# Patient Record
Sex: Male | Born: 1950 | Race: Black or African American | Hispanic: No | Marital: Married
Health system: Midwestern US, Community
[De-identification: ages and names within clinical notes are randomized; demographics above are authoritative.]

## PROBLEM LIST (undated history)

## (undated) DIAGNOSIS — E785 Hyperlipidemia, unspecified: Secondary | ICD-10-CM

## (undated) DIAGNOSIS — H409 Unspecified glaucoma: Secondary | ICD-10-CM

## (undated) DIAGNOSIS — IMO0002 Reserved for concepts with insufficient information to code with codable children: Secondary | ICD-10-CM

## (undated) DIAGNOSIS — I4891 Unspecified atrial fibrillation: Secondary | ICD-10-CM

## (undated) DIAGNOSIS — H269 Unspecified cataract: Secondary | ICD-10-CM

## (undated) HISTORY — DX: Unspecified cataract: H26.9

## (undated) HISTORY — DX: Unspecified atrial fibrillation: I48.91

## (undated) HISTORY — DX: Unspecified glaucoma: H40.9

## (undated) HISTORY — PX: COLONOSCOPY: SHX174

## (undated) HISTORY — DX: Hyperlipidemia, unspecified: E78.5

## (undated) HISTORY — DX: Reserved for concepts with insufficient information to code with codable children: IMO0002

---

## 2000-06-28 ENCOUNTER — Ambulatory Visit (HOSPITAL_COMMUNITY): Admission: RE | Admit: 2000-06-28 | Discharge: 2000-06-28 | Payer: Self-pay | Admitting: *Deleted

## 2000-06-28 ENCOUNTER — Encounter: Payer: Self-pay | Admitting: *Deleted

## 2000-07-05 ENCOUNTER — Ambulatory Visit (HOSPITAL_BASED_OUTPATIENT_CLINIC_OR_DEPARTMENT_OTHER): Admission: RE | Admit: 2000-07-05 | Discharge: 2000-07-06 | Payer: Self-pay | Admitting: *Deleted

## 2000-09-29 ENCOUNTER — Ambulatory Visit (HOSPITAL_BASED_OUTPATIENT_CLINIC_OR_DEPARTMENT_OTHER): Admission: RE | Admit: 2000-09-29 | Discharge: 2000-09-29 | Payer: Self-pay | Admitting: *Deleted

## 2005-06-01 ENCOUNTER — Ambulatory Visit: Payer: Self-pay | Admitting: Internal Medicine

## 2005-07-08 ENCOUNTER — Ambulatory Visit: Payer: Self-pay | Admitting: Internal Medicine

## 2005-07-23 ENCOUNTER — Ambulatory Visit: Payer: Self-pay | Admitting: Internal Medicine

## 2005-08-23 ENCOUNTER — Emergency Department (HOSPITAL_COMMUNITY): Admission: EM | Admit: 2005-08-23 | Discharge: 2005-08-23 | Payer: Self-pay | Admitting: Emergency Medicine

## 2006-01-21 ENCOUNTER — Ambulatory Visit: Payer: Self-pay | Admitting: Family Medicine

## 2007-01-27 ENCOUNTER — Ambulatory Visit: Payer: Self-pay | Admitting: Family Medicine
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
## 2008-02-28 ENCOUNTER — Ambulatory Visit: Payer: Self-pay | Admitting: Family Medicine

## 2009-03-01 ENCOUNTER — Ambulatory Visit: Payer: Self-pay | Admitting: Family Medicine

## 2010-03-03 ENCOUNTER — Ambulatory Visit: Payer: Self-pay | Admitting: Family Medicine

## 2010-09-19 NOTE — Op Note (Signed)
[place]  Patient:    Riley Beasley, Riley Beasley                         MRN: [id number] Proc. Date: 07/05/00 Adm. Date:  [id number] Attending:  Kendell Bane CC:         Humberto Leep. Wyonia Hough, M.D.  Ronnald Nian, M.D.   Operative Report  PREOPERATIVE DIAGNOSIS:  Scaphoid nonunion, right wrist.  POSTOPERATIVE DIAGNOSIS:  Scaphoid nonunion, right wrist.  PROCEDURE:  Open reduction and internal fixation, right scaphoid, with iliac crest bone grafting.  SURGEON:  Lowell Bouton, M.D.  ANESTHESIA:  General.  OPERATIVE FINDINGS:  The patient had a scaphoid nonunion that was very unstable with a DISI deformity of the lunate.  The proximal pole was partially sclerotic; however, there was good bleeding at the nonunion site.  DESCRIPTION OF PROCEDURE:  Under general anesthesia with a tourniquet on the right arm, the right arm and right iliac crest were prepped and draped in the usual fashion and after exsanguinating the limb, the tourniquet was inflated to 250 mmHg.  A longitudinal incision was made just radial to the flexor carpi radialis tendon and zigzagged across the wrist crease out onto the base of the thumb.  Sharp dissection was carried through the subcutaneous tissues, and bleeding points were coagulated.  Sharp dissection was carried through the flexor carpi radialis sheath and the base of it, retracting the FCR tendon ulnarly.  Sharp dissection was carried down to the scaphoid and an Alm retractor was inserted for retraction.  The fracture site was easily identified, and a curette was used to debride the fracture site.  There were cystic changes, and the cystic material was removed from the fracture site.  A rongeur was used to level off the fracture edges.  The fracture was grossly unstable and easily opened up with wrist extension.  After completely clearing out the fracture site of any cystic debris, the edges of the bone  were drilled and there was good punctate bleeding from the proximal pole at the fracture site.  An oblique incision was then made over the right anterior iliac crest, and dissection was carried through the subcutaneous tissue with a needle-tip electrocautery.  Dissection was carried sharply with the electrocautery on the top of the iliac crest, and a Key elevator was used to elevate the periosteum medially and laterally.  A half-inch osteotome was then used to remove a wedge of bone from the top of the iliac crest.  A curette was used to remove cancellous bone.  The iliac crest donor site was then packed with a saline-soaked sponge.  The bone graft was then cleaned of any soft tissues using a rongeur and was placed into the scaphoid defect as an opening wedge volarly.  X-rays showed good position and improvement of the DISI deformity without complete correction.  Size 45 K-wires x 3 were then placed from distal to proximal in the distal fragment, making sure to cross the fracture site. The fracture was packed with cancellous bone and then the corticocancellous graft was inserted volarly with extra cortical bone to open the volar wedge even more.  X-rays showed good position of the graft, and the K-wires were passed across the graft, capturing it in the fracture site.  The pins were found to be adequately positioned on x-ray, and the DISI deformity had improved.  The pins were cut beneath the thenar eminence skin and buried.  The  wounds were then irrigated copiously with saline.  The iliac crest site bleeding was controlled with bone wax, and the fascia was closed with a 0 chromic suture.  The subcutaneous tissue was closed with 2-0 chromic and the skin with a 3-0 subcuticular Prolene.  Steri-Strips were applied, followed by a sterile dressing.  The wrist wound was closed with a 4-0 Vicryl in the ligament volarly, and the subcutaneous tissues over a vessel loop drain.  The skin was closed  with a 3-0 subcuticular Prolene.  Steri-Strips were applied, followed by sterile dressings and a thumb spica splint.  The tourniquet was released with good circulation to the hand.  The patient went to the recovery room awake and stable, in good condition. DD:  07/05/00 TD:  07/06/00 Job: [id number]

## 2010-09-19 NOTE — Op Note (Signed)
[place]  Patient:    Riley Beasley, Riley Beasley                         MRN: [id number] Proc. Date: 09/29/00 Adm. Date:  [id number] Attending:  Kendell Bane                           Operative Report  PREOPERATIVE DIAGNOSIS:  Status post open reduction internal fixation, right scaphoid nonunion.  POSTOPERATIVE DIAGNOSIS:  Status post open reduction internal fixation, right scaphoid nonunion.  OPERATION PERFORMED:  Removal of pins, right scaphoid.  SURGEON:  Lowell Bouton, M.D.  ANESTHESIA:  General.  OPERATIVE FINDINGS:  The patient had two pins that were buried deep in the muscle of the thenar eminence.  One pin came out quite easily and one came out more tightly.  DESCRIPTION OF PROCEDURE:  Under general anesthesia with the tourniquet on the right arm, the right hand was prepped and draped in the usual fashion and after elevating the limb, the tourniquet was inflated to  250 mmHg.  A 2 cm incision was made over the thenar eminence and blunt dissection carried through the subcutaneous tissues.  Blunt dissection was carried down through the thenar muscle and the pins were identified and were covered over by scar tissue.  This was incised and both pins were removed with a needle holder. The wound was irrigated and 0.5% Marcaine was used for pain control.  The wound was closed with 4-0 nylon sutures.  Sterile dressings were applied. The patient tolerated the procedure well and went to the recovery room awake and stable in good condition. DD:  09/29/00 TD:  09/29/00 Job: [id number]

## 2011-02-12 ENCOUNTER — Encounter: Payer: Self-pay | Admitting: Family Medicine

## 2011-03-11 ENCOUNTER — Ambulatory Visit (INDEPENDENT_AMBULATORY_CARE_PROVIDER_SITE_OTHER): Payer: BC Managed Care – PPO | Admitting: Family Medicine

## 2011-03-11 ENCOUNTER — Encounter: Payer: Self-pay | Admitting: Family Medicine

## 2011-03-11 VITALS — BP 128/84 | HR 64 | Ht 70.5 in | Wt 188.0 lb

## 2011-03-11 DIAGNOSIS — E559 Vitamin D deficiency, unspecified: Secondary | ICD-10-CM

## 2011-03-11 DIAGNOSIS — Z8679 Personal history of other diseases of the circulatory system: Secondary | ICD-10-CM

## 2011-03-11 DIAGNOSIS — Z23 Encounter for immunization: Secondary | ICD-10-CM

## 2011-03-11 DIAGNOSIS — Z0000 Encounter for general adult medical examination without abnormal findings: Secondary | ICD-10-CM

## 2011-03-11 DIAGNOSIS — Z8042 Family history of malignant neoplasm of prostate: Secondary | ICD-10-CM | POA: Insufficient documentation

## 2011-03-11 DIAGNOSIS — E785 Hyperlipidemia, unspecified: Secondary | ICD-10-CM | POA: Insufficient documentation

## 2011-03-11 LAB — POCT URINALYSIS DIPSTICK
Blood, UA: NEGATIVE
Glucose, UA: NEGATIVE
Ketones, UA: NEGATIVE
Spec Grav, UA: 1.015
Urobilinogen, UA: 0.2

## 2011-03-11 LAB — COMPREHENSIVE METABOLIC PANEL
Albumin: 4.8 g/dL (ref 3.5–5.2)
CO2: 26 mEq/L (ref 19–32)
Glucose, Bld: 85 mg/dL (ref 70–99)
Potassium: 4.5 mEq/L (ref 3.5–5.3)
Sodium: 139 mEq/L (ref 135–145)
Total Protein: 7.2 g/dL (ref 6.0–8.3)

## 2011-03-11 LAB — CBC WITH DIFFERENTIAL/PLATELET
Eosinophils Absolute: 0.1 10*3/uL (ref 0.0–0.7)
Hemoglobin: 15 g/dL (ref 13.0–17.0)
Lymphs Abs: 2.2 10*3/uL (ref 0.7–4.0)
MCH: 27 pg (ref 26.0–34.0)
Monocytes Relative: 9 % (ref 3–12)
Neutro Abs: 2.9 10*3/uL (ref 1.7–7.7)
Neutrophils Relative %: 50 % (ref 43–77)
RBC: 5.56 MIL/uL (ref 4.22–5.81)

## 2011-03-11 LAB — LIPID PANEL
Cholesterol: 221 mg/dL — ABNORMAL HIGH (ref 0–200)
Triglycerides: 115 mg/dL (ref <150)
VLDL: 23 mg/dL (ref 0–40)

## 2011-03-11 LAB — POC HEMOCCULT BLD/STL (OFFICE/1-CARD/DIAGNOSTIC): Fecal Occult Blood, POC: NEGATIVE

## 2011-03-11 NOTE — Patient Instructions (Signed)
Continues to take excellent care of yourself

## 2011-03-11 NOTE — Progress Notes (Signed)
Subjective:    Patient ID: Riley Beasley, male    DOB: 04-May-1951, 60 y.o.   MRN: [id number]  HPI He is here for complete examination. He has now gotten married. He has been dating this woman for approximately 15 years. He has been married twice in the past which made him a low reluctant to get married again but things are going quite well. He does have a previous history of atrial fibrillation but has not had difficulty with this. He also has a family history of prostate cancer. He does have a history of vitamin D deficiency. He has no particular concerns or complaints   Review of Systems  Constitutional: Negative.   HENT: Negative.   Eyes: Negative.   Respiratory: Negative.   Cardiovascular: Negative.   Gastrointestinal: Negative.   Genitourinary: Negative.   Musculoskeletal: Negative.   Skin: Negative.   Neurological: Negative.   Psychiatric/Behavioral: Negative.        Objective:   Physical Exam BP 128/84  Pulse 64  Ht 5' 10.5" (1.791 m)  Wt 188 lb (85.276 kg)  BMI 26.59 kg/m2  General Appearance:    Alert, cooperative, no distress, appears stated age  Head:    Normocephalic, without obvious abnormality, atraumatic  Eyes:    PERRL, conjunctiva/corneas clear, EOM's intact, fundi    benign  Ears:    Normal TM's and external ear canals  Nose:   Nares normal, mucosa normal, no drainage or sinus   tenderness  Throat:   Lips, mucosa, and tongue normal; teeth and gums normal  Neck:   Supple, no lymphadenopathy;  thyroid:  no   enlargement/tenderness/nodules; no carotid   bruit or JVD  Back:    Spine nontender, no curvature, ROM normal, no CVA     tenderness  Lungs:     Clear to auscultation bilaterally without wheezes, rales or     ronchi; respirations unlabored  Chest Wall:    No tenderness or deformity   Heart:    Regular rate and rhythm, S1 and S2 normal, no murmur, rub   or gallop  Breast Exam:    No chest wall tenderness, masses or gynecomastia  Abdomen:     Soft,  non-tender, nondistended, normoactive bowel sounds,    no masses, no hepatosplenomegaly  Genitalia:    Normal male external genitalia without lesions.  Testicles without masses.  No inguinal hernias.  Rectal:    Normal sphincter tone, no masses or tenderness; guaiac negative stool.  Prostate smooth, no nodules, not enlarged.  Extremities:   No clubbing, cyanosis or edema  Pulses:   2+ and symmetric all extremities  Skin:   Skin color, texture, turgor normal, no rashes or lesions  Lymph nodes:   Cervical, supraclavicular, and axillary nodes normal  Neurologic:   CNII-XII intact, normal strength, sensation and gait; reflexes 2+ and symmetric throughout          Psych:   Normal mood, affect, hygiene and grooming.           Assessment & Plan:   1. Routine general medical examination at a health care facility  Flu vaccine greater than or equal to 3yo with preservative IM, Tdap vaccine greater than or equal to 7yo IM, CBC with Differential, Comprehensive metabolic panel, Lipid panel, POCT Urinalysis Dipstick  2. History of atrial fibrillation    3. Hyperlipidemia LDL goal < 100  Lipid panel  4. Family history of prostate cancer  PSA  5. Vitamin D deficiency  Vitamin D 25  hydroxy   encouraged him to continue with his active lifestyle.

## 2011-03-12 LAB — VITAMIN D 25 HYDROXY (VIT D DEFICIENCY, FRACTURES): Vit D, 25-Hydroxy: 60 ng/mL (ref 30–89)

## 2012-03-09 ENCOUNTER — Encounter: Payer: Self-pay | Admitting: Internal Medicine

## 2012-03-15 ENCOUNTER — Ambulatory Visit (INDEPENDENT_AMBULATORY_CARE_PROVIDER_SITE_OTHER): Payer: BC Managed Care – PPO | Admitting: Family Medicine

## 2012-03-15 ENCOUNTER — Encounter: Payer: Self-pay | Admitting: Family Medicine

## 2012-03-15 VITALS — BP 116/70 | HR 70 | Ht 70.0 in | Wt 186.0 lb

## 2012-03-15 DIAGNOSIS — Z8042 Family history of malignant neoplasm of prostate: Secondary | ICD-10-CM

## 2012-03-15 DIAGNOSIS — Z8639 Personal history of other endocrine, nutritional and metabolic disease: Secondary | ICD-10-CM

## 2012-03-15 DIAGNOSIS — Z23 Encounter for immunization: Secondary | ICD-10-CM

## 2012-03-15 DIAGNOSIS — Z0000 Encounter for general adult medical examination without abnormal findings: Secondary | ICD-10-CM

## 2012-03-15 DIAGNOSIS — I4891 Unspecified atrial fibrillation: Secondary | ICD-10-CM

## 2012-03-15 DIAGNOSIS — E785 Hyperlipidemia, unspecified: Secondary | ICD-10-CM

## 2012-03-15 LAB — CBC WITH DIFFERENTIAL/PLATELET
Basophils Absolute: 0.1 10*3/uL (ref 0.0–0.1)
Basophils Relative: 1 % (ref 0–1)
Eosinophils Relative: 2 % (ref 0–5)
HCT: 46.9 % (ref 39.0–52.0)
Hemoglobin: 15.8 g/dL (ref 13.0–17.0)
MCHC: 33.7 g/dL (ref 30.0–36.0)
MCV: 82.3 fL (ref 78.0–100.0)
Monocytes Absolute: 0.5 10*3/uL (ref 0.1–1.0)
Monocytes Relative: 8 % (ref 3–12)
Neutro Abs: 3.4 10*3/uL (ref 1.7–7.7)
RDW: 15 % (ref 11.5–15.5)

## 2012-03-15 LAB — POCT URINALYSIS DIPSTICK
Bilirubin, UA: NEGATIVE
Glucose, UA: NEGATIVE
Ketones, UA: NEGATIVE
Leukocytes, UA: NEGATIVE
Nitrite, UA: NEGATIVE
pH, UA: 6

## 2012-03-15 LAB — HEMOCCULT GUIAC POC 1CARD (OFFICE)

## 2012-03-15 MED ORDER — INFLUENZA VIRUS VACC SPLIT PF IM SUSP: 0.5000 mL | Freq: Once | INTRAMUSCULAR | Status: DC

## 2012-03-15 NOTE — Progress Notes (Signed)
  Subjective:    Patient ID: Riley Beasley, male    DOB: 1951-02-04, 61 y.o.   MRN: [id number]  HPI He is here for complete examination. He continues on medications listed in the chart. He has no particular concerns or complaints. He is now involved in a [employer] exercise program. His work is going well. His marriage is also doing quite well. He has no plans of retiring anytime in the near future. Social and family history were reviewed  Review of Systems  Constitutional: Negative.   HENT: Negative.   Eyes: Negative.   Respiratory: Negative.   Cardiovascular: Negative.   Gastrointestinal: Negative.   Musculoskeletal: Negative.   Skin: Negative.   Neurological: Negative.   Hematological: Negative.   Psychiatric/Behavioral: Negative.        Objective:   Physical Exam BP 116/70  Pulse 70  Ht 5\' 10"  (1.778 m)  Wt 186 lb (84.369 kg)  BMI 26.69 kg/m2  General Appearance:    Alert, cooperative, no distress, appears stated age  Head:    Normocephalic, without obvious abnormality, atraumatic  Eyes:    PERRL, conjunctiva/corneas clear, EOM's intact, fundi    benign  Ears:    Normal TM's and external ear canals  Nose:   Nares normal, mucosa normal, no drainage or sinus   tenderness  Throat:   Lips, mucosa, and tongue normal; teeth and gums normal  Neck:   Supple, no lymphadenopathy;  thyroid:  no   enlargement/tenderness/nodules; no carotid   bruit or JVD  Back:    Spine nontender, no curvature, ROM normal, no CVA     tenderness  Lungs:     Clear to auscultation bilaterally without wheezes, rales or     ronchi; respirations unlabored  Chest Wall:    No tenderness or deformity   Heart:    Irregular rate and rhythm, S1 and S2 normal, no murmur, rub   or gallop  Breast Exam:    No chest wall tenderness, masses or gynecomastia  Abdomen:     Soft, non-tender, nondistended, normoactive bowel sounds,    no masses, no hepatosplenomegaly  Genitalia:    Normal male external genitalia without  lesions.  Testicles without masses.  No inguinal hernias.  Rectal:    Normal sphincter tone, no masses or tenderness; guaiac negative stool.  Prostate smooth, no nodules, not enlarged.  Extremities:   No clubbing, cyanosis or edema  Pulses:   2+ and symmetric all extremities  Skin:   Skin color, texture, turgor normal, no rashes or lesions  Lymph nodes:   Cervical, supraclavicular, and axillary nodes normal  Neurologic:   CNII-XII intact, normal strength, sensation and gait; reflexes 2+ and symmetric throughout          Psych:   Normal mood, affect, hygiene and grooming.         Assessment & Plan:   1. Routine general medical examination at a health care facility  POCT Urinalysis Dipstick, Vitamin D 25 hydroxy, Lipid panel, CBC with Differential, Comprehensive metabolic panel, Hemoccult - 1 Card (office)  2. Need for prophylactic vaccination and inoculation against influenza  influenza  inactive virus vaccine (FLUZONE/FLUARIX) injection 0.5 mL  3. History of vitamin D deficiency  Vitamin D 25 hydroxy  4. Hyperlipidemia LDL goal < 100  Lipid panel  5. Family history of prostate cancer  PSA  6. Atrial fibrillation     encouraged him to continue to take good care of himself.

## 2012-03-16 LAB — COMPREHENSIVE METABOLIC PANEL
Albumin: 4.5 g/dL (ref 3.5–5.2)
Alkaline Phosphatase: 54 U/L (ref 39–117)
BUN: 16 mg/dL (ref 6–23)
Calcium: 9.4 mg/dL (ref 8.4–10.5)
Chloride: 102 mEq/L (ref 96–112)
Creat: 1.23 mg/dL (ref 0.50–1.35)
Glucose, Bld: 95 mg/dL (ref 70–99)
Potassium: 4.5 mEq/L (ref 3.5–5.3)

## 2012-03-16 LAB — LIPID PANEL
Cholesterol: 194 mg/dL (ref 0–200)
HDL: 49 mg/dL (ref >39)
LDL Cholesterol: 123 mg/dL — ABNORMAL HIGH (ref 0–99)
Triglycerides: 111 mg/dL (ref <150)

## 2012-03-16 LAB — VITAMIN D 25 HYDROXY (VIT D DEFICIENCY, FRACTURES): Vit D, 25-Hydroxy: 59 ng/mL (ref 30–89)

## 2013-03-09 ENCOUNTER — Other Ambulatory Visit: Payer: Self-pay

## 2013-03-17 ENCOUNTER — Encounter: Payer: Self-pay | Admitting: Family Medicine

## 2013-03-17 ENCOUNTER — Ambulatory Visit (INDEPENDENT_AMBULATORY_CARE_PROVIDER_SITE_OTHER): Payer: No Typology Code available for payment source | Admitting: Family Medicine

## 2013-03-17 VITALS — BP 116/70 | HR 48 | Ht 70.0 in | Wt 183.0 lb

## 2013-03-17 DIAGNOSIS — Z8719 Personal history of other diseases of the digestive system: Secondary | ICD-10-CM

## 2013-03-17 DIAGNOSIS — Z125 Encounter for screening for malignant neoplasm of prostate: Secondary | ICD-10-CM

## 2013-03-17 DIAGNOSIS — Z87898 Personal history of other specified conditions: Secondary | ICD-10-CM

## 2013-03-17 DIAGNOSIS — Z23 Encounter for immunization: Secondary | ICD-10-CM

## 2013-03-17 DIAGNOSIS — Z0000 Encounter for general adult medical examination without abnormal findings: Secondary | ICD-10-CM

## 2013-03-17 DIAGNOSIS — Z8679 Personal history of other diseases of the circulatory system: Secondary | ICD-10-CM

## 2013-03-17 DIAGNOSIS — Z8639 Personal history of other endocrine, nutritional and metabolic disease: Secondary | ICD-10-CM

## 2013-03-17 DIAGNOSIS — R195 Other fecal abnormalities: Secondary | ICD-10-CM

## 2013-03-17 DIAGNOSIS — Z8042 Family history of malignant neoplasm of prostate: Secondary | ICD-10-CM

## 2013-03-17 LAB — CBC WITH DIFFERENTIAL/PLATELET
HCT: 45.2 % (ref 39.0–52.0)
Hemoglobin: 15.4 g/dL (ref 13.0–17.0)
Lymphocytes Relative: 32 % (ref 12–46)
Monocytes Absolute: 0.5 10*3/uL (ref 0.1–1.0)
Monocytes Relative: 8 % (ref 3–12)
Neutro Abs: 3.5 10*3/uL (ref 1.7–7.7)
WBC: 6.1 10*3/uL (ref 4.0–10.5)

## 2013-03-17 LAB — COMPREHENSIVE METABOLIC PANEL
BUN: 13 mg/dL (ref 6–23)
CO2: 30 mEq/L (ref 19–32)
Calcium: 9.3 mg/dL (ref 8.4–10.5)
Chloride: 101 mEq/L (ref 96–112)
Creat: 1.14 mg/dL (ref 0.50–1.35)

## 2013-03-17 LAB — HEMOCCULT GUIAC POC 1CARD (OFFICE)

## 2013-03-17 LAB — POCT URINALYSIS DIPSTICK
Glucose, UA: NEGATIVE
Spec Grav, UA: 1.01
Urobilinogen, UA: NEGATIVE

## 2013-03-17 NOTE — Progress Notes (Signed)
Subjective:    Patient ID: Riley Beasley, male    DOB: [birth year], 62 y.o.   MRN: [id number]  HPI He is here for complete examination. He takes excellent care of himself. He is involved in [employer]. He takes over-the-counter medications. There is a family history of prostate cancer. He also has remote history of atrial fibrillation. Review of his record indicates he was history of vitamin D deficiency. His family and social history were reviewed. Also has a previous history of hemorrhoids. His last colonoscopy was 2007. He also complains of bilateral elbow discomfort with physical activities. He does lift weights and notes pain with weight lifting. Review of Systems Negative except as above    Objective:   Physical Exam BP 116/70  Pulse 48  Ht 5\' 10"  (1.778 m)  Wt 183 lb (83.008 kg)  BMI 26.26 kg/m2  SpO2 99%  General Appearance:    Alert, cooperative, no distress, appears stated age  Head:    Normocephalic, without obvious abnormality, atraumatic  Eyes:    PERRL, conjunctiva/corneas clear, EOM's intact, fundi    benign  Ears:    Normal TM's and external ear canals  Nose:   Nares normal, mucosa normal, no drainage or sinus   tenderness  Throat:   Lips, mucosa, and tongue normal; teeth and gums normal  Neck:   Supple, no lymphadenopathy;  thyroid:  no   enlargement/tenderness/nodules; no carotid   bruit or JVD  Back:    Spine nontender, no curvature, ROM normal, no CVA     tenderness  Lungs:     Clear to auscultation bilaterally without wheezes, rales or     ronchi; respirations unlabored  Chest Wall:    No tenderness or deformity   Heart:    Regular rate and rhythm, S1 and S2 normal, no murmur, rub   or gallop  Breast Exam:    No chest wall tenderness, masses or gynecomastia  Abdomen:     Soft, non-tender, nondistended, normoactive bowel sounds,    no masses, no hepatosplenomegaly  Genitalia:    Normal male external genitalia without lesions.  Testicles without masses.  No inguinal  hernias.  Rectal:    Normal sphincter tone, no masses or tenderness; guaiac positive left stool.  Prostate smooth, no nodules, not enlarged.  Extremities:   No clubbing, cyanosis or edema.slight tenderness palpation noted over both lateral epicondyles.   Pulses:   2+ and symmetric all extremities  Skin:   Skin color, texture, turgor normal, no rashes or lesions  Lymph nodes:   Cervical, supraclavicular, and axillary nodes normal  Neurologic:   CNII-XII intact, normal strength, sensation and gait; reflexes 2+ and symmetric throughout          Psych:   Normal mood, affect, hygiene and grooming.          Assessment & Plan:  Routine general medical examination at a health care facility - Plan: POCT Urinalysis Dipstick, Hemoccult - 1 Card (office), Comprehensive metabolic panel, CBC with Differential, PSA  Need for prophylactic vaccination and inoculation against influenza - Plan: Flu Vaccine QUAD 36+ mos PF IM (Fluarix)  History of atrial fibrillation  Family history of prostate cancer - Plan: PSA  History of hemorrhoids - Plan: Hemoccult - 1 Card (office)  History of vitamin D deficiency  Stool guaiac positive - Plan: Hemoccult - 1 Card (office), Ambulatory referral to Gastroenterology  Special screening for malignant neoplasm of prostate - Plan: PSA  explained that his elbow pain is heading  towards lateral epicondylitis and did explain doing as many things as possible pounds up and open. Flu shot given with risks and benefits discussed. Referral was made due to the guaiac-positive stool.

## 2013-03-18 LAB — VITAMIN D 25 HYDROXY (VIT D DEFICIENCY, FRACTURES): Vit D, 25-Hydroxy: 59 ng/mL (ref 30–89)

## 2013-03-20 ENCOUNTER — Encounter: Payer: Self-pay | Admitting: Internal Medicine

## 2013-04-19 ENCOUNTER — Ambulatory Visit (INDEPENDENT_AMBULATORY_CARE_PROVIDER_SITE_OTHER): Payer: No Typology Code available for payment source | Admitting: Internal Medicine

## 2013-04-19 ENCOUNTER — Encounter: Payer: Self-pay | Admitting: Internal Medicine

## 2013-04-19 ENCOUNTER — Telehealth: Payer: Self-pay | Admitting: Internal Medicine

## 2013-04-19 VITALS — BP 130/80 | HR 60 | Ht 70.0 in | Wt 188.0 lb

## 2013-04-19 DIAGNOSIS — R195 Other fecal abnormalities: Secondary | ICD-10-CM

## 2013-04-19 MED ORDER — NA SULFATE-K SULFATE-MG SULF 17.5-3.13-1.6 GM/177ML PO SOLN: ORAL | Status: DC

## 2013-04-19 NOTE — Assessment & Plan Note (Signed)
Cause not clear - could be hemorrhoids but last colonoscopy 2007 so appropriate to repeat colonoscopy. The risks and benefits as well as alternatives of endoscopic procedure(s) have been discussed and reviewed. All questions answered. The patient agrees to proceed.

## 2013-04-19 NOTE — Telephone Encounter (Signed)
Spoke to patient and put sample suprep kit up front for pick up.  He said thank you.

## 2013-04-19 NOTE — Patient Instructions (Signed)

## 2013-04-19 NOTE — Progress Notes (Signed)
         Subjective:  Referred by Dr. Sharlot Gowda   Patient ID: Riley Beasley, male    DOB: January 31, 1951, 62 y.o.   MRN: [id number]  HPI patient is a very pleasant middle-aged African American man known to me from previous colonoscopy in 2007. On recent physical exam he was found to be heme + on a digital rectal exam. He does describe rare BRBPR when wiping. Some belching no heartburn. Trial of over-the-counter Prilosec made no difference in the past. He says his dentist is told him he might have some erosions in his teeth related to acid reflux though he has no heartburn. GI review of systems is otherwise negative. No Known Allergies Outpatient Prescriptions Prior to Visit  Medication Sig Dispense Refill  . aspirin 81 MG tablet Take 81 mg by mouth daily.        . cholecalciferol (VITAMIN D) 1000 UNITS tablet Take 1,000 Units by mouth daily.      . fish oil-omega-3 fatty acids 1000 MG capsule Take 2 g by mouth daily.        Marland Kitchen glucosamine-chondroitin 500-400 MG tablet Take 1 tablet by mouth 3 (three) times daily.         No facility-administered medications prior to visit.   Past Medical History  Diagnosis Date  . Hemorrhoids   . Dyslipidemia   . Vitamin D deficiency   . Atrial fib/flutter, transient    Past Surgical History  Procedure Laterality Date  . Colonoscopy  2007    Dr. Leone Payor   History   Social History  . Marital Status: Married    Spouse Name: N/A    Number of Children: 3  . Years of Education: N/A   Occupational History  . engineer    Social History Main Topics  . Smoking status: Never Smoker   . Smokeless tobacco: Never Used  . Alcohol Use: Yes     Comment: beer 1-2 6packs over the weekend  . Drug Use: No  . Sexual Activity: Yes   Other Topics Concern  . None   Social History Narrative   Married, 2 sons 1 daughter   Engineer   2 caffeine drinks qd   Updated 04/19/2013         Family History  Problem Relation Age of Onset  . Prostate cancer  Brother   . Heart disease Brother    Review of Systems This is positive for those things mentiones in the HPI. All other review of systems are negative.      Objective:   Physical Exam General:  NAD Eyes:   anicteric Lungs:  clear Heart:  S1S2 no rubs, murmurs or gallops Abdomen:  soft and nontender, BS+, no HSM/mass Neuro/Psych: Alert and oriented, appropriate affect  Data Reviewed:  2007 colonoscopy will be scanned and filed Lab Results  Component Value Date   WBC 6.1 03/17/2013   HGB 15.4 03/17/2013   HCT 45.2 03/17/2013   MCV 80.9 03/17/2013   PLT 169 03/17/2013      Assessment & Plan:   1. Heme + stool    I appreciate the opportunity to care for this patient. CC: Carollee Herter, MD

## 2013-04-26 ENCOUNTER — Encounter: Payer: Self-pay | Admitting: Internal Medicine

## 2013-06-08 ENCOUNTER — Ambulatory Visit (AMBULATORY_SURGERY_CENTER): Payer: No Typology Code available for payment source | Admitting: Internal Medicine

## 2013-06-08 VITALS — BP 121/62 | HR 58 | Temp 97.4°F | Resp 17 | Ht 70.0 in | Wt 188.0 lb

## 2013-06-08 DIAGNOSIS — R195 Other fecal abnormalities: Secondary | ICD-10-CM

## 2013-06-08 DIAGNOSIS — K648 Other hemorrhoids: Secondary | ICD-10-CM

## 2013-06-08 MED ORDER — SODIUM CHLORIDE 0.9 % IV SOLN: 500.0000 mL | INTRAVENOUS | Status: DC

## 2013-06-08 NOTE — Progress Notes (Signed)
Report to pacu rn, vss, bbs=clear 

## 2013-06-08 NOTE — Op Note (Signed)
[facility] [address] [place], [id number]   COLONOSCOPY PROCEDURE REPORT  PATIENT: Riley ArabianDavis, Riley  MR#: [id number] BIRTHDATE: 06/24/1950 , 62  yrs. old GENDER: Male ENDOSCOPIST: Iva Booparl E Gessner, MD, [facility] PROCEDURE DATE:  06/08/2013 PROCEDURE:   Colonoscopy, diagnostic First Screening Colonoscopy - Avg.  risk and is 50 yrs.  old or older - No.  Prior Negative Screening - Now for repeat screening. N/A  History of Adenoma - Now for follow-up colonoscopy & has been > or = to 3 yrs.  N/A  Polyps Removed Today? No.  Recommend repeat exam, <10 yrs? No. ASA CLASS:   Class II INDICATIONS:heme-positive stool. MEDICATIONS: propofol (Diprivan) 300mg  IV, MAC sedation, administered by CRNA, and These medications were titrated to patient response per physician's verbal order  DESCRIPTION OF PROCEDURE:   After the risks benefits and alternatives of the procedure were thoroughly explained, informed consent was obtained.  A digital rectal exam revealed no abnormalities of the rectum, A digital rectal exam revealed no prostatic nodules, and A digital rectal exam revealed the prostate was not enlarged.   The LB [id number]  endoscope was introduced through the anus and advanced to the cecum, which was identified by both the appendix and ileocecal valve. No adverse events experienced.   The quality of the prep was excellent using Suprep  The instrument was then slowly withdrawn as the colon was fully examined.      COLON FINDINGS: A normal appearing cecum, ileocecal valve, and appendiceal orifice were identified.  The ascending, hepatic flexure, transverse, splenic flexure, descending, sigmoid colon and rectum appeared unremarkable.  No polyps or cancers were seen. Retroflexed views revealed internal hemorrhoids. The time to cecum=2 minutes 33 seconds.  Withdrawal time=11 minutes 47 seconds. The scope was withdrawn and the procedure completed. COMPLICATIONS: There were  no complications.  ENDOSCOPIC IMPRESSION: 1.   Normal colon - excellent prep 2.   Internal hemorrhoids - likely source heme + stool  RECOMMENDATIONS: Repeat routine colonoscopy 10 years - 2025   eSigned:  Iva Booparl E Gessner, MD, [address] 06/08/2013 2:40 PM   cc: Sharlot GowdaJohn LaLonde, MD and The Patient

## 2013-06-08 NOTE — Patient Instructions (Signed)
Discharge instructions given with verbal understanding. Handout on hemorrhoids. Resume previous medications. YOU HAD AN ENDOSCOPIC PROCEDURE TODAY AT THE [facility]: Refer to the procedure report that was given to you for any specific questions about what was found during the examination.  If the procedure report does not answer your questions, please call your gastroenterologist to clarify.  If you requested that your care partner not be given the details of your procedure findings, then the procedure report has been included in a sealed envelope for you to review at your convenience later.  YOU SHOULD EXPECT: Some feelings of bloating in the abdomen. Passage of more gas than usual.  Walking can help get rid of the air that was put into your GI tract during the procedure and reduce the bloating. If you had a lower endoscopy (such as a colonoscopy or flexible sigmoidoscopy) you may notice spotting of blood in your stool or on the toilet paper. If you underwent a bowel prep for your procedure, then you may not have a normal bowel movement for a few days.  DIET: Your first meal following the procedure should be a light meal and then it is ok to progress to your normal diet.  A half-sandwich or bowl of soup is an example of a good first meal.  Heavy or fried foods are harder to digest and may make you feel nauseous or bloated.  Likewise meals heavy in dairy and vegetables can cause extra gas to form and this can also increase the bloating.  Drink plenty of fluids but you should avoid alcoholic beverages for 24 hours.  ACTIVITY: Your care partner should take you home directly after the procedure.  You should plan to take it easy, moving slowly for the rest of the day.  You can resume normal activity the day after the procedure however you should NOT DRIVE or use heavy machinery for 24 hours (because of the sedation medicines used during the test).    SYMPTOMS TO REPORT IMMEDIATELY: A  gastroenterologist can be reached at any hour.  During normal business hours, 8:30 AM to 5:00 PM Monday through Friday, call [phone].  After hours and on weekends, please call the GI answering service at [phone] who will take a message and have the physician on call contact you.   Following lower endoscopy (colonoscopy or flexible sigmoidoscopy):  Excessive amounts of blood in the stool  Significant tenderness or worsening of abdominal pains  Swelling of the abdomen that is new, acute  Fever of 100F or higher  FOLLOW UP: If any biopsies were taken you will be contacted by phone or by letter within the next 1-3 weeks.  Call your gastroenterologist if you have not heard about the biopsies in 3 weeks.  Our staff will call the home number listed on your records the next business day following your procedure to check on you and address any questions or concerns that you may have at that time regarding the information given to you following your procedure. This is a courtesy call and so if there is no answer at the home number and we have not heard from you through the emergency physician on call, we will assume that you have returned to your regular daily activities without incident.  SIGNATURES/CONFIDENTIALITY: You and/or your care partner have signed paperwork which will be entered into your electronic medical record.  These signatures attest to the fact that that the information above on your After Visit Summary has been  reviewed and is understood.  Full responsibility of the confidentiality of this discharge information lies with you and/or your care-partner. 

## 2013-06-09 ENCOUNTER — Telehealth: Payer: Self-pay | Admitting: *Deleted

## 2013-06-09 NOTE — Telephone Encounter (Signed)
  Follow up Call-  Call back number 06/08/2013  Post procedure Call Back phone  # [phone]  Permission to leave phone message Yes     Patient questions:  Do you have a fever, pain , or abdominal swelling? no Pain Score  0 *  Have you tolerated food without any problems? yes  Have you been able to return to your normal activities? yes  Do you have any questions about your discharge instructions: Diet   no Medications  no Follow up visit  no  Do you have questions or concerns about your Care? no  Actions: * If pain score is 4 or above: No action needed, pain <4.

## 2013-09-26 ENCOUNTER — Encounter: Payer: Self-pay | Admitting: Family Medicine

## 2013-09-26 ENCOUNTER — Ambulatory Visit (INDEPENDENT_AMBULATORY_CARE_PROVIDER_SITE_OTHER): Payer: No Typology Code available for payment source | Admitting: Family Medicine

## 2013-09-26 VITALS — BP 120/82 | HR 72 | Wt 185.0 lb

## 2013-09-26 DIAGNOSIS — S39012A Strain of muscle, fascia and tendon of lower back, initial encounter: Secondary | ICD-10-CM

## 2013-09-26 DIAGNOSIS — IMO0002 Reserved for concepts with insufficient information to code with codable children: Secondary | ICD-10-CM

## 2013-09-26 NOTE — Progress Notes (Signed)
   Subjective:    Patient ID: Riley Beasley, male    DOB: [birth year], 63 y.o.   MRN: [id number]  HPI Last Thursday he was doing exercises and had no difficulty with them however woke up Friday morning with stiff back. He exercises with similar to ones he is always done in the past. He has been slowly getting better and today is even improving further. No numbness, tingling or weakness   Review of Systems     Objective:   Physical Exam Normal motion of his back. No tenderness to palpation noted. Negative straight leg raising.       Assessment & Plan:  Back strain  since he is slowly getting better, I recommended no further intervention. Discussed proper posturing.

## 2013-09-26 NOTE — Patient Instructions (Signed)
Proper posturing and you can take as much as 4 Advil 3 times per day

## 2014-02-14 ENCOUNTER — Encounter: Payer: Self-pay | Admitting: Internal Medicine

## 2014-02-16 ENCOUNTER — Other Ambulatory Visit: Payer: Self-pay

## 2014-03-22 ENCOUNTER — Encounter: Payer: Self-pay | Admitting: Family Medicine

## 2014-03-22 ENCOUNTER — Ambulatory Visit (INDEPENDENT_AMBULATORY_CARE_PROVIDER_SITE_OTHER): Payer: No Typology Code available for payment source | Admitting: Family Medicine

## 2014-03-22 VITALS — BP 120/80 | HR 70 | Ht 70.5 in | Wt 187.0 lb

## 2014-03-22 DIAGNOSIS — Z0000 Encounter for general adult medical examination without abnormal findings: Secondary | ICD-10-CM

## 2014-03-22 DIAGNOSIS — I482 Chronic atrial fibrillation, unspecified: Secondary | ICD-10-CM

## 2014-03-22 DIAGNOSIS — Z8042 Family history of malignant neoplasm of prostate: Secondary | ICD-10-CM

## 2014-03-22 DIAGNOSIS — E785 Hyperlipidemia, unspecified: Secondary | ICD-10-CM

## 2014-03-22 DIAGNOSIS — K640 First degree hemorrhoids: Secondary | ICD-10-CM

## 2014-03-22 DIAGNOSIS — H409 Unspecified glaucoma: Secondary | ICD-10-CM | POA: Insufficient documentation

## 2014-03-22 DIAGNOSIS — Z23 Encounter for immunization: Secondary | ICD-10-CM

## 2014-03-22 LAB — POCT URINALYSIS DIPSTICK
BILIRUBIN UA: NEGATIVE
Glucose, UA: NEGATIVE
Ketones, UA: NEGATIVE
Leukocytes, UA: NEGATIVE
Nitrite, UA: NEGATIVE
PH UA: 6
PROTEIN UA: 0.15
RBC UA: NEGATIVE
Spec Grav, UA: >=1.03
Urobilinogen, UA: NEGATIVE

## 2014-03-22 NOTE — Progress Notes (Signed)
Subjective:     Patient ID: Riley Beasley, male   DOB: 30-Jun-1950, 63 y.o.   MRN: [id number]  HPI  This is a 63 year old male with a history of irregular heart beat, glaucoma, and hyperlipidemia who is being seen for a comprehensive physical examination.  In the interval since his last visit he has had no acute complaints and his acute back pain resolved with conservative management.  He follows with his ophhtalmologist regularly for his glaucoma.  He is not smoking, happy in his relationship with his third wife, employed as an Horticulturist, commercialarchitectural engineer, and works out 4-5x weekly using p90x. He has a previous history of hyperlipidemia. A colonoscopy to follow-up on a positive hemoccult during the last year was notable only for an internal hemorrhoid.  He has not had his annual flu shot.  He has a CHADS2 score of 1.  Review of Systems  Constitutional: Negative for fever, chills and fatigue.  HENT: Positive for tinnitus. Negative for congestion and ear pain.   Eyes: Positive for visual disturbance.  Respiratory: Negative for chest tightness and shortness of breath.   Cardiovascular: Negative for chest pain and palpitations.  Gastrointestinal: Negative for nausea, vomiting, abdominal pain, constipation, blood in stool and abdominal distention.  Endocrine: Negative for polydipsia and polyuria.  Genitourinary: Negative for urgency and difficulty urinating.  Musculoskeletal: Negative for back pain and arthralgias.  Neurological: Negative for dizziness, syncope and headaches.  Hematological: Negative for adenopathy.  Psychiatric/Behavioral: Negative for dysphoric mood.   His medical, surgical, family, and social history were reviewed and updated in the medical record.  His family history is notable for alcoholism in his father, diabetes in his mother, and metastatic prostate cancer in his older brother.  His social history is notable for his excellent physical activity and drinking 6-12 beers on the  weekend.  He is a non-smoker.    Objective:   Physical Exam  Filed Vitals:   03/22/14 0827  BP: 120/80  Pulse: 70   General: Pleasant in NAD. HEENT: Tympanic membranes normal.  Nasal mucosa clear.  Oropharynx clear with normal dentition.  Fundoscopic exam limited due to blurring.  No lymphadenopathy or thyromegaly. CV: Normal S1/S2 without murmurs, gallops, or rubs.  Irregular heartbeat present Pulm: clear bilaterally with normal work of breathing Abd: Soft, nontender, normal bowel sounds, without tenderness to deep palpation.  Liver edge non-palpable.  Spleen non palpable. MSK/Derm:  No skin changes noted. Extremities: No cyanosis, clubbing, or palmar erythema.  2+ symmetric radial, DP, and PT pulses.  Labs: CBC, CMP, PSA in 2014 wnl.  Lipid panel in 2013 w/ LDL of 123  EKG today remarkable for Afib/Flutter, blunted q wave in V4, and irregular irregularity.   Review of EKG from 2007 is consistent with these findings and unchanged    Assessment:     This is a 63 year old male presenting for an annual well exam without acute complaints.  His major problem is glaucoma managed by his ophtalmologist.  Otherwise his arrhythmia is chronic & stable, he is normotensive, and his physical activity is exceptional.  His health management is up to date except for his flu vaccination which he will receive today. He does not need to be on anticoagulation according to his CHADS2 score.  His EKG performed at this visit is concerning for a remote infarct and chronic afib/flutter.  The R wave depression is increased on his current EKG, but modest, and likely represents inconsistent lead placement.   Plan:  Routine general medical examination at a health care facility - Plan: POCT Urinalysis Dipstick  Chronic atrial fibrillation - Chronic & cleared by cardiology. - Plan: EKG 12-Lead  Hyperlipidemia - Lipid panel today.  Glaucoma - Followed by ophtlamologist.  Family history of prostate cancer -  2014 PSA wnl.  Check today. - Plan: PSA  First degree hemorrhoids  Influenza vaccine needed - Plan: Flu Vaccine QUAD 36+ mos PF IM (Fluarix Quad PF)   - C. Merlyn Lotaylor Nipp, MS3, [facility] Seen with Dr. Ronnald NianJohn C. Lalonde who is in agreement with the above findings and plan.

## 2014-03-23 LAB — PSA: PSA: 1.34 ng/mL (ref <=4.00)

## 2014-06-12 DIAGNOSIS — H4011X2 Primary open-angle glaucoma, moderate stage: Secondary | ICD-10-CM | POA: Diagnosis not present

## 2014-07-17 NOTE — Telephone Encounter (Signed)
-----   Message from Diona BrownerJanae L Gillyard sent at 07/17/2014  3:12 PM EDT -----  Regarding: Dr. Justice BritainMoore/Telephone   Pt came by the office on last week requesting to re establish primary care with Christell ConstantMoore. Pt would like a call back regarding the doctor's decision. Please call pt today if possible at [phone] or home [phone].

## 2014-08-07 DIAGNOSIS — H5213 Myopia, bilateral: Secondary | ICD-10-CM | POA: Diagnosis not present

## 2014-08-07 DIAGNOSIS — H2513 Age-related nuclear cataract, bilateral: Secondary | ICD-10-CM | POA: Diagnosis not present

## 2014-08-07 DIAGNOSIS — H4011X2 Primary open-angle glaucoma, moderate stage: Secondary | ICD-10-CM | POA: Diagnosis not present

## 2014-08-16 ENCOUNTER — Encounter: Attending: Internal Medicine

## 2014-10-09 ENCOUNTER — Ambulatory Visit: Admit: 2014-10-09 | Payer: PRIVATE HEALTH INSURANCE | Attending: Internal Medicine

## 2014-10-09 DIAGNOSIS — I10 Essential (primary) hypertension: Secondary | ICD-10-CM

## 2014-10-09 NOTE — Progress Notes (Signed)
HISTORY OF PRESENT ILLNESS  Austin Walker is a 64 y.o. male.  HPI   Pt here to establish care  Has been receiving medical care form [facility] last several years  Internist at [facility] is Dr. Martyn Malay  Sees pysch Dr. Theodis Blaze  Has been on disability r/t depression, anxiety and stress since early this year  Worked in telecomunications  Has had htn hld controlled with medicines  Had labs recently , also has prediabetes but has been able to lose weight. Has discussed metformin with MD at [facility]  Has b/l knee pain and sciatica right leg  He will f/u at [facility] regularly for now  Plans on retiring this summer    Patient Active Problem List    Diagnosis Date Noted   ??? Hypertension with goal blood pressure less than 140/90 10/09/2014   ??? Pure hypercholesterolemia 10/09/2014   ??? Anxiety 10/09/2014   ??? Mild single current episode of major depressive disorder (HCC) 10/09/2014   ??? 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 OSA on CPAP 10/09/2014   ??? DJD (degenerative joint disease) 10/09/2014   ??? Sciatica 10/09/2014   ??? Prediabetes 10/09/2014   ??? Gastroesophageal reflux disease without esophagitis 10/09/2014   ??? TIA (transient ischemic attack) 10/09/2014   ??? Morbid obesity due to excess calories (HCC) 10/09/2014     Current Outpatient Prescriptions   Medication Sig Dispense Refill   ??? aspirin delayed-release 81 mg tablet Take  by mouth daily.     ??? atorvastatin (LIPITOR) 80 mg tablet Take 80 mg by mouth daily.     ??? capsicum oleoresin 0.025 % topical cream Apply  to affected area three (3) times daily.     ??? carboxymethylcellulose sodium 1 % dlgl Apply  to eye.     ??? fluticasone (FLONASE) 50 mcg/actuation nasal spray 2 Sprays by Both Nostrils route once.     ??? gabapentin (NEURONTIN) 600 mg tablet Take  by mouth two (2) times a day.     ??? ketotifen (ZADITOR) 0.025 % (0.035 %) ophthalmic solution Administer 1 Drop to both eyes two (2) times a day.     ??? loratadine (CLARITIN) 10 mg tablet Take 10 mg by mouth.      ??? naproxen (NAPROSYN) 375 mg tablet Take 375 mg by mouth two (2) times daily (with meals).     ??? NIFEdipine ER (PROCARDIA XL) 60 mg ER tablet Take 60 mg by mouth daily.     ??? sertraline (ZOLOFT) 100 mg tablet Take 150 mg by mouth.     ??? candesartan-hydrochlorothiazide (ATACAND HCT) 32-12.5 mg per tablet Take 1 Tab by mouth daily.     ??? ketoconazole (NIZORAL) 2 % topical cream Apply  to affected area daily.     ??? Omeprazole delayed release (PRILOSEC D/R) 20 mg tablet Take 20 mg by mouth daily.       No Known Allergies  No past medical history on file.  Past Surgical History   Procedure Laterality Date   ??? Hx urological       sx at [facility] due to inabilitly to void at 5-6   ??? Hx tonsillectomy       Family History   Problem Relation Age of Onset   ??? Lung Disease Mother      History   Substance Use Topics   ??? Smoking status: Never Smoker    ??? Smokeless tobacco: Not on file   ??? Alcohol Use: No  Review of Systems   Constitutional: Negative for fever, chills, weight loss and malaise/fatigue.   Eyes: Negative for blurred vision and double vision.   Respiratory: Negative for cough and shortness of breath.    Cardiovascular: Negative for chest pain and palpitations.   Gastrointestinal: Negative for nausea, vomiting, abdominal pain, diarrhea, constipation, blood in stool and melena.   Genitourinary: Negative for dysuria, urgency, frequency and hematuria.   Musculoskeletal: Positive for back pain and joint pain. Negative for myalgias and falls.   Neurological: Negative for dizziness, tremors and headaches.   Psychiatric/Behavioral: Positive for depression. The patient is nervous/anxious and has insomnia.        Physical Exam   Constitutional: He appears well-developed and well-nourished. No distress.   Appears stated age, obese, nad   HENT:   Head: Normocephalic.   Eyes: Pupils are equal, round, and reactive to light.   Neck: Normal range of motion. No JVD present. No tracheal deviation  present. No thyromegaly present.   Cardiovascular: Normal rate, regular rhythm, normal heart sounds and intact distal pulses.  Exam reveals no gallop and no friction rub.    No murmur heard.  Pulmonary/Chest: Effort normal and breath sounds normal. No respiratory distress. He has no wheezes. He has no rales. He exhibits no tenderness.   Abdominal: Soft. He exhibits no distension and no mass. There is no rebound and no guarding.   Musculoskeletal: He exhibits no edema.   Lymphadenopathy:     He has no cervical adenopathy.   Neurological: He is alert.   Psychiatric: He has a normal mood and affect.   Nursing note and vitals reviewed.      ASSESSMENT and PLAN  Austin Walker was seen today for establish care.    Diagnoses and all orders for this visit:    Essential hypertension with goal blood pressure less than 140/90   Good control, continue medicines  Pure hypercholesterolemia   Continue lipitor   [facility]       Anxiety   [facility]   On disability for this condition    Prediabetes   Further weight reduction needed-discussed    Screening   Will be due for repeat colonoscopy in 2 years   [facility]   Had pneumovax per pt    Will review records pt brought to office today  Follow-up Disposition:  Return in about 1 year (around 10/09/2015) for prediaebtes htn hld depression.L

## 2014-10-29 ENCOUNTER — Other Ambulatory Visit: Payer: Self-pay

## 2014-12-05 DIAGNOSIS — H4011X2 Primary open-angle glaucoma, moderate stage: Secondary | ICD-10-CM | POA: Diagnosis not present

## 2015-03-25 ENCOUNTER — Ambulatory Visit (INDEPENDENT_AMBULATORY_CARE_PROVIDER_SITE_OTHER): Payer: Medicare Other | Admitting: Family Medicine

## 2015-03-25 ENCOUNTER — Encounter: Payer: Self-pay | Admitting: Family Medicine

## 2015-03-25 VITALS — BP 140/82 | HR 83 | Ht 71.0 in | Wt 190.4 lb

## 2015-03-25 DIAGNOSIS — H409 Unspecified glaucoma: Secondary | ICD-10-CM

## 2015-03-25 DIAGNOSIS — Z0000 Encounter for general adult medical examination without abnormal findings: Secondary | ICD-10-CM | POA: Diagnosis not present

## 2015-03-25 DIAGNOSIS — Z418 Encounter for other procedures for purposes other than remedying health state: Secondary | ICD-10-CM | POA: Diagnosis not present

## 2015-03-25 DIAGNOSIS — Z1159 Encounter for screening for other viral diseases: Secondary | ICD-10-CM

## 2015-03-25 DIAGNOSIS — I482 Chronic atrial fibrillation, unspecified: Secondary | ICD-10-CM

## 2015-03-25 DIAGNOSIS — E785 Hyperlipidemia, unspecified: Secondary | ICD-10-CM | POA: Diagnosis not present

## 2015-03-25 DIAGNOSIS — Z125 Encounter for screening for malignant neoplasm of prostate: Secondary | ICD-10-CM | POA: Diagnosis not present

## 2015-03-25 DIAGNOSIS — Z8042 Family history of malignant neoplasm of prostate: Secondary | ICD-10-CM

## 2015-03-25 DIAGNOSIS — Z23 Encounter for immunization: Secondary | ICD-10-CM

## 2015-03-25 DIAGNOSIS — Z299 Encounter for prophylactic measures, unspecified: Secondary | ICD-10-CM

## 2015-03-25 LAB — LIPID PANEL
Cholesterol: 208 mg/dL — ABNORMAL HIGH (ref 125–200)
HDL: 51 mg/dL (ref >=40)
LDL Cholesterol: 137 mg/dL — ABNORMAL HIGH (ref <130)
TRIGLYCERIDES: 98 mg/dL (ref <150)
Total CHOL/HDL Ratio: 4.1 Ratio (ref <=5.0)
VLDL: 20 mg/dL (ref <30)

## 2015-03-25 LAB — POCT URINALYSIS DIPSTICK
BILIRUBIN UA: NEGATIVE
Blood, UA: NEGATIVE
Glucose, UA: NEGATIVE
Ketones, UA: NEGATIVE
Leukocytes, UA: NEGATIVE
Nitrite, UA: NEGATIVE
Protein, UA: NEGATIVE
SPEC GRAV UA: 1.025
Urobilinogen, UA: NEGATIVE
pH, UA: 6

## 2015-03-25 LAB — CBC WITH DIFFERENTIAL/PLATELET
BASOS PCT: 0 % (ref 0–1)
Basophils Absolute: 0 10*3/uL (ref 0.0–0.1)
Eosinophils Absolute: 0.1 10*3/uL (ref 0.0–0.7)
Eosinophils Relative: 2 % (ref 0–5)
HCT: 47.2 % (ref 39.0–52.0)
HEMOGLOBIN: 15.8 g/dL (ref 13.0–17.0)
LYMPHS PCT: 37 % (ref 12–46)
Lymphs Abs: 2.1 10*3/uL (ref 0.7–4.0)
MCH: 28 pg (ref 26.0–34.0)
MCHC: 33.5 g/dL (ref 30.0–36.0)
MCV: 83.7 fL (ref 78.0–100.0)
MONOS PCT: 7 % (ref 3–12)
MPV: 11.3 fL (ref 8.6–12.4)
Monocytes Absolute: 0.4 10*3/uL (ref 0.1–1.0)
NEUTROS ABS: 3.1 10*3/uL (ref 1.7–7.7)
Neutrophils Relative %: 54 % (ref 43–77)
Platelets: 159 10*3/uL (ref 150–400)
RBC: 5.64 MIL/uL (ref 4.22–5.81)
RDW: 14.8 % (ref 11.5–15.5)
WBC: 5.8 10*3/uL (ref 4.0–10.5)

## 2015-03-25 LAB — COMPREHENSIVE METABOLIC PANEL
ALBUMIN: 4.4 g/dL (ref 3.6–5.1)
ALK PHOS: 48 U/L (ref 40–115)
ALT: 18 U/L (ref 9–46)
AST: 24 U/L (ref 10–35)
BILIRUBIN TOTAL: 0.9 mg/dL (ref 0.2–1.2)
BUN: 9 mg/dL (ref 7–25)
CO2: 28 mmol/L (ref 20–31)
CREATININE: 1.07 mg/dL (ref 0.70–1.25)
Calcium: 9.2 mg/dL (ref 8.6–10.3)
Chloride: 104 mmol/L (ref 98–110)
Glucose, Bld: 94 mg/dL (ref 65–99)
Potassium: 4.8 mmol/L (ref 3.5–5.3)
SODIUM: 140 mmol/L (ref 135–146)
TOTAL PROTEIN: 6.9 g/dL (ref 6.1–8.1)

## 2015-03-25 NOTE — Progress Notes (Signed)
Subjective:   Riley Beasley is a 64 y.o. male who presents for an Initial Medicare Annual Wellness Visit. He also has a history of hyperlipidemia as well as glaucoma. He is followed by ophthalmology for his glaucoma. He also has chronic atrial fibrillation and presently is on no medication. His CHADS is 0 he does have a family history of prostate cancer. Review of Systems  No chest pain, shortness of breath, GI or GU issues. The rest of his ROS is negative      Objective:    Today's Vitals   03/25/15 0843  BP: 140/82  Pulse: 83  Height: 5\' 11"  (1.803 m)  Weight: 190 lb 6.4 oz (86.365 kg)  SpO2: 99%  Alert and in no distress. Tympanic membranes and canals are normal. Pharyngeal area is normal. Neck is supple without adenopathy or thyromegaly. Cardiac exam shows an irregular  rhythm without murmurs or gallops. Lungs are clear to auscultation.   Current Medications (verified) Outpatient Encounter Prescriptions as of 03/25/2015  Medication Sig  . aspirin 81 MG tablet Take 81 mg by mouth daily.    . cholecalciferol (VITAMIN D) 1000 UNITS tablet Take 1,000 Units by mouth daily.  . fish oil-omega-3 fatty acids 1000 MG capsule Take 2 g by mouth daily.    Marland Kitchen. glucosamine-chondroitin 500-400 MG tablet Take 1 tablet by mouth 3 (three) times daily.    . travoprost, benzalkonium, (TRAVATAN) 0.004 % ophthalmic solution Place 1 drop into both eyes at bedtime. Gets out of [place]  . Zinc Sulfate (ZINC 15 PO) Take by mouth.   No facility-administered encounter medications on file as of 03/25/2015.    Allergies (verified) Review of patient's allergies indicates no known allergies.   History: Past Medical History  Diagnosis Date  . Hemorrhoids   . Dyslipidemia   . Vitamin D deficiency   . Atrial fib/flutter, transient    Past Surgical History  Procedure Laterality Date  . Colonoscopy  2015, normal    Dr. Leone PayorGessner   Family History  Problem Relation Age of Onset  . Prostate cancer  Brother   . Heart disease Brother   . Diabetes Mother   . Alcohol abuse Father   . Cancer Brother     Prostate cancer   Social History   Occupational History  . engineer    Social History Main Topics  . Smoking status: Never Smoker   . Smokeless tobacco: Never Used  . Alcohol Use: Yes     Comment: beer 1-2 6packs over the weekend  . Drug Use: No  . Sexual Activity: Yes   Tobacco Counseling Counseling given: Not Answered   Activities of Daily Living In your present state of health, do you have any difficulty performing the following activities: 03/25/2015  Hearing? N  Vision? N  Difficulty concentrating or making decisions? N  Walking or climbing stairs? N  Dressing or bathing? N  Doing errands, shopping? N    Immunizations and Health Maintenance Immunization History  Administered Date(s) Administered  . DTaP 05/03/2003  . Influenza Split 03/11/2011, 03/15/2012  . Influenza Whole 03/03/2010  . Influenza,inj,Quad PF,36+ Mos 03/17/2013, 03/22/2014  . Tdap 03/11/2011  . Zoster 03/03/2010   Health Maintenance Due  Topic Date Due  . Hepatitis C Screening  January 26, 1951  . HIV Screening  04/25/1964  . PNA vac Low Risk Adult (1 of 2 - PCV13) 04/25/2014  . INFLUENZA VACCINE  12/03/2014    Patient Care Team: Ronnald NianJohn C Elisandro Jarrett, MD as PCP - General (  Family Medicine)  Indicate any recent Medical Services you may have received from other than [facility] providers in the past year (date may be approximate).    Assessment:   This is a routine wellness examination for Riley Beasley. Chronic atrial fibrillation (HCC)  Hyperlipidemia - Plan: Lipid panel  Glaucoma  Family history of prostate cancer - Plan: POCT Urinalysis Dipstick, CBC with Differential/Platelet, Comprehensive metabolic panel, PSA, Medicare  Need for prophylactic vaccination against Streptococcus pneumoniae (pneumococcus) - Plan: Pneumococcal conjugate vaccine 13-valent  Need for prophylactic measure - Plan: Flu vaccine  HIGH DOSE PF (Fluzone High dose)  Need for hepatitis C screening test - Plan: Hepatitis C antibody  Screening for prostate cancer - Plan: PSA, Medicare  Need for prophylactic vaccination and inoculation against influenza - Plan: Flu vaccine HIGH DOSE PF (Fluzone High dose)   Hearing/Vision screen Done Dietary issues and exercise activities discussed:    Goals    None     told to continue to take good care of himself. Depression Screen PHQ 2/9 Scores 03/25/2015 03/22/2014 03/17/2013 03/15/2012  PHQ - 2 Score 0 0 0 0    Fall Risk Fall Risk  03/25/2015 03/25/2015 03/22/2014 03/17/2013  Falls in the past year? No No No No    Cognitive Function: No flowsheet data found.  Screening Tests Health Maintenance  Topic Date Due  . Hepatitis C Screening  1950-05-05  . HIV Screening  04/25/1964  . PNA vac Low Risk Adult (1 of 2 - PCV13) 04/25/2014  . INFLUENZA VACCINE  12/03/2014  . TETANUS/TDAP  03/10/2021  . COLONOSCOPY  06/09/2023  . ZOSTAVAX  Completed        Plan:    continue to take good care of himself. Chronic atrial fibrillation (HCC)  Hyperlipidemia - Plan: Lipid panel  Glaucoma  Family history of prostate cancer - Plan: POCT Urinalysis Dipstick, CBC with Differential/Platelet, Comprehensive metabolic panel, PSA, Medicare  Need for prophylactic vaccination against Streptococcus pneumoniae (pneumococcus) - Plan: Pneumococcal conjugate vaccine 13-valent  Need for prophylactic measure - Plan: Flu vaccine HIGH DOSE PF (Fluzone High dose)  Need for hepatitis C screening test - Plan: Hepatitis C antibody  Screening for prostate cancer - Plan: PSA, Medicare  Need for prophylactic vaccination and inoculation against influenza - Plan: Flu vaccine HIGH DOSE PF (Fluzone High dose)   During the course of the visit Dinesh was educated and counseled about the following appropriate screening and preventive services:   Vaccines to include Pneumoccal, Influenza,  Hepatitis B, Td, Zostavax, HCV  Electrocardiogram  Colorectal cancer screening  Cardiovascular disease screening  Diabetes screening  Glaucoma screening  Nutrition counseling  Prostate cancer screening  Smoking cessation counseling  Patient Instructions (the written plan) were given to the patient.   Carollee Herter, MD   03/25/2015

## 2015-03-25 NOTE — Patient Instructions (Addendum)
  Mr. Riley Beasley , Thank you for taking time to come for your Medicare Wellness Visit. I appreciate your ongoing commitment to your health goals. Please review the following plan we discussed and let me know if I can assist you in the future.   These are the goals we discussed: Continue to take good care of yourself  This is a list of the screening recommended for you and due dates:  Complete

## 2015-03-26 LAB — PSA, MEDICARE: PSA: 1.63 ng/mL (ref <=4.00)

## 2015-03-26 LAB — HEPATITIS C ANTIBODY: HCV Ab: NEGATIVE

## 2015-04-09 DIAGNOSIS — H401132 Primary open-angle glaucoma, bilateral, moderate stage: Secondary | ICD-10-CM | POA: Diagnosis not present

## 2015-07-15 ENCOUNTER — Ambulatory Visit: Admit: 2015-07-15 | Payer: MEDICARE | Attending: Internal Medicine

## 2015-07-15 DIAGNOSIS — I10 Essential (primary) hypertension: Secondary | ICD-10-CM

## 2015-07-15 MED ORDER — CANDESARTAN-HYDROCHLOROTHIAZIDE 32 MG-25 MG TAB
32-25 mg | ORAL_TABLET | Freq: Every day | ORAL | 3 refills | Status: AC
Start: 2015-07-15 — End: ?

## 2015-07-15 MED ORDER — CANDESARTAN-HYDROCHLOROTHIAZIDE 32 MG-25 MG TAB
32-25 mg | ORAL_TABLET | Freq: Every day | ORAL | 3 refills | Status: DC
Start: 2015-07-15 — End: 2015-07-15

## 2015-07-15 NOTE — Progress Notes (Signed)
HISTORY OF PRESENT ILLNESS  Austin Walker y.o. male.  HPI   F/u for HTN HLD  Prediabetes  [facility] for primary care and pysch-anxiety  [facility]  Hurts allover in joints and tends to fall more-still rides motorcycle  Due to see primary MD Dr Sherryll BurgerShah  bp has been up a down last few months  Walks for exericse but has knee pain and sometimes uses a cane  Has some numbess and tingling of hands worse at night when sleeping which awakes him from sleep  Notes grip strength is down    Patient Active Problem List    Diagnosis Date Noted   ??? Hypertension with goal blood pressure less than 140/90 10/09/2014   ??? Pure hypercholesterolemia 10/09/2014   ??? Anxiety 10/09/2014   ??? Mild single current episode of major depressive disorder (HCC) 10/09/2014   ??? 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 OSA on CPAP 10/09/2014   ??? DJD (degenerative joint disease) 10/09/2014   ??? Sciatica 10/09/2014   ??? Prediabetes 10/09/2014   ??? Gastroesophageal reflux disease without esophagitis 10/09/2014   ??? TIA (transient ischemic attack) 10/09/2014   ??? Morbid obesity due to excess calories (HCC) 10/09/2014     Current Outpatient Prescriptions   Medication Sig Dispense Refill   ??? prazosin (MINIPRESS) 2 mg capsule Take 2 mg by mouth nightly.     ??? busPIRone (BUSPAR) 10 mg tablet Take 10 mg by mouth three (3) times daily.     ??? candesartan-hydroCHLOROthiazide (ATACAND HCT) 32-25 mg tab per tablet Take 1 Tab by mouth daily. 90 Tab 3   ??? aspirin delayed-release 81 mg tablet Take  by mouth daily.     ??? atorvastatin (LIPITOR) 80 mg tablet Take 80 mg by mouth daily.     ??? capsicum oleoresin 0.025 % topical cream Apply  to affected area three (3) times daily.     ??? carboxymethylcellulose sodium 1 % dlgl Apply  to eye.     ??? fluticasone (FLONASE) 50 mcg/actuation nasal spray 2 Sprays by Both Nostrils route once.     ??? gabapentin (NEURONTIN) 600 mg tablet Take  by mouth two (2) times a day.      ??? ketotifen (ZADITOR) 0.025 % (0.035 %) ophthalmic solution Administer 1 Drop to both eyes two (2) times a day.     ??? loratadine (CLARITIN) 10 mg tablet Take 10 mg by mouth.     ??? NIFEdipine ER (PROCARDIA XL) 60 mg ER tablet Take 60 mg by mouth daily.     ??? sertraline (ZOLOFT) 100 mg tablet Take 150 mg by mouth.     ??? ketoconazole (NIZORAL) 2 % topical cream Apply  to affected area daily.     ??? Omeprazole delayed release (PRILOSEC D/R) 20 mg tablet Take 20 mg by mouth daily.     ??? naproxen (NAPROSYN) 375 mg tablet Take 375 mg by mouth two (2) times daily (with meals).       No Known Allergies        ROS    Physical Exam   Constitutional: He appears well-developed and well-nourished. No distress.   Appears stated age, obese   HENT:   Head: Normocephalic.   Cardiovascular: Normal rate, regular rhythm and normal heart sounds.  Exam reveals no gallop and no friction rub.    No murmur heard.  Pulmonary/Chest: Effort normal and breath sounds normal. No  respiratory distress. He has no wheezes. He has no rales. He exhibits no tenderness.   Abdominal: Soft.   Abdominal obesity   Musculoskeletal: He exhibits no edema.   Neurological: He is alert.   tinels and phalens negative b/l   Psychiatric: He has a normal mood and affect.   Nursing note and vitals reviewed.      ASSESSMENT and PLAN  Austin Walker was seen today for complete physical.    Diagnoses and all orders for this visit:    Essential hypertension   Mild elevation   Increase atacand/hct to 32/25 mg every day   F/u PCP at [facility] in new weeks    Pure hypercholesterolemia   Continue lipitor-=-labs at [facility]    Prediabetes   Weight reduction needed-discussed    Carpal tunnel syndrome, unspecified laterality  -     REFERRAL TO ORTHOPEDICS  Anxiety   F/u [facility], contorlled  Other orders  -     Discontinue: candesartan-hydroCHLOROthiazide (ATACAND HCT) 32-25 mg tab per tablet; Take 1 Tab by mouth daily.  -     Discontinue: candesartan-hydroCHLOROthiazide (ATACAND HCT) 32-25 mg  tab per tablet; Take 1 Tab by mouth daily.  -     candesartan-hydroCHLOROthiazide (ATACAND HCT) 32-25 mg tab per tablet; Take 1 Tab by mouth daily.      Follow-up Disposition:  Return in about 1 year (around 07/14/2016) for htn .

## 2015-08-21 DIAGNOSIS — H401132 Primary open-angle glaucoma, bilateral, moderate stage: Secondary | ICD-10-CM | POA: Diagnosis not present

## 2015-12-24 DIAGNOSIS — H401132 Primary open-angle glaucoma, bilateral, moderate stage: Secondary | ICD-10-CM | POA: Diagnosis not present

## 2016-03-24 ENCOUNTER — Ambulatory Visit (INDEPENDENT_AMBULATORY_CARE_PROVIDER_SITE_OTHER): Payer: Medicare Other | Admitting: Family Medicine

## 2016-03-24 ENCOUNTER — Encounter: Payer: Self-pay | Admitting: Family Medicine

## 2016-03-24 VITALS — BP 130/90 | HR 72 | Ht 71.0 in | Wt 188.0 lb

## 2016-03-24 DIAGNOSIS — H409 Unspecified glaucoma: Secondary | ICD-10-CM | POA: Diagnosis not present

## 2016-03-24 DIAGNOSIS — Z8639 Personal history of other endocrine, nutritional and metabolic disease: Secondary | ICD-10-CM | POA: Diagnosis not present

## 2016-03-24 DIAGNOSIS — I482 Chronic atrial fibrillation, unspecified: Secondary | ICD-10-CM

## 2016-03-24 DIAGNOSIS — Z125 Encounter for screening for malignant neoplasm of prostate: Secondary | ICD-10-CM

## 2016-03-24 DIAGNOSIS — E785 Hyperlipidemia, unspecified: Secondary | ICD-10-CM

## 2016-03-24 DIAGNOSIS — Z8042 Family history of malignant neoplasm of prostate: Secondary | ICD-10-CM

## 2016-03-24 DIAGNOSIS — R7989 Other specified abnormal findings of blood chemistry: Secondary | ICD-10-CM

## 2016-03-24 DIAGNOSIS — Z23 Encounter for immunization: Secondary | ICD-10-CM

## 2016-03-24 DIAGNOSIS — E559 Vitamin D deficiency, unspecified: Secondary | ICD-10-CM

## 2016-03-24 LAB — CBC WITH DIFFERENTIAL/PLATELET
BASOS PCT: 0 %
Basophils Absolute: 0 cells/uL (ref 0–200)
EOS ABS: 165 {cells}/uL (ref 15–500)
Eosinophils Relative: 3 %
HEMATOCRIT: 45.6 % (ref 38.5–50.0)
HEMOGLOBIN: 15.7 g/dL (ref 13.2–17.1)
Lymphocytes Relative: 40 %
Lymphs Abs: 2200 cells/uL (ref 850–3900)
MCH: 28.3 pg (ref 27.0–33.0)
MCHC: 34.4 g/dL (ref 32.0–36.0)
MCV: 82.3 fL (ref 80.0–100.0)
MONO ABS: 385 {cells}/uL (ref 200–950)
MPV: 11.4 fL (ref 7.5–12.5)
Monocytes Relative: 7 %
NEUTROS ABS: 2750 {cells}/uL (ref 1500–7800)
Neutrophils Relative %: 50 %
Platelets: 160 10*3/uL (ref 140–400)
RBC: 5.54 MIL/uL (ref 4.20–5.80)
RDW: 15.2 % — ABNORMAL HIGH (ref 11.0–15.0)
WBC: 5.5 10*3/uL (ref 4.0–10.5)

## 2016-03-24 LAB — COMPREHENSIVE METABOLIC PANEL
ALBUMIN: 4.4 g/dL (ref 3.6–5.1)
ALK PHOS: 45 U/L (ref 40–115)
ALT: 18 U/L (ref 9–46)
AST: 25 U/L (ref 10–35)
BUN: 12 mg/dL (ref 7–25)
CALCIUM: 8.9 mg/dL (ref 8.6–10.3)
CO2: 28 mmol/L (ref 20–31)
Chloride: 104 mmol/L (ref 98–110)
Creat: 1.2 mg/dL (ref 0.70–1.25)
Glucose, Bld: 104 mg/dL — ABNORMAL HIGH (ref 65–99)
POTASSIUM: 4.7 mmol/L (ref 3.5–5.3)
Sodium: 139 mmol/L (ref 135–146)
TOTAL PROTEIN: 6.9 g/dL (ref 6.1–8.1)
Total Bilirubin: 0.5 mg/dL (ref 0.2–1.2)

## 2016-03-24 LAB — PSA: PSA: 1.2 ng/mL (ref <=4.0)

## 2016-03-24 LAB — LIPID PANEL
CHOL/HDL RATIO: 4.1 ratio (ref <5.0)
CHOLESTEROL: 195 mg/dL (ref <200)
HDL: 48 mg/dL (ref >40)
LDL Cholesterol: 130 mg/dL — ABNORMAL HIGH (ref <100)
Triglycerides: 87 mg/dL (ref <150)
VLDL: 17 mg/dL (ref <30)

## 2016-03-24 NOTE — Progress Notes (Signed)
Subjective:   HPI  Riley Beasley is a 65 y.o. male who presents for a Annual wellness visit and medication check.  Medical care team includes:  Dr.Therman Oph  Dr.Artis den.   Preventative care: Last ophthalmology visit:6/17  Last dental visit: dec 17 Last colonoscopy:06/08/13 Last prostate exam: 2016 Last EKG:03/22/14 Last labs:03/25/15  Prior vaccinations: TD or Tdap:03/11/11 Influenza:03/24/16 Pneumococcal:13 03/25/15 Shingles/Zostavax:03/03/10 Flu and no low-back skin  Advanced directive: No. Information given Concerns: He takes excellent care of himself exercising regularly. Drinks very little. Work is going quite well. He does have a previous history of difficulty with atrial flutter but a chads score of 0. There is also history of vitamin D deficiency. He does have a history of hyperlipidemia but at a very minimally elevated level. There is a family history of prostate cancer. He also has a history of glaucoma and is being followed by ophthalmology and is on drops. His work and home life are going quite well. He has no other concerns or complaints.  Reviewed his medical, surgical, family, social, medication, and allergy history and updated chart as appropriate.    Review of Systems Constitutional: -fever, -chills, -sweats, -unexpected weight change, -decreased appetite, -fatigue Allergy: -sneezing, -itching, -congestion ROS otherwise negative   Objective:   Physical Exam   General appearance: alert, no distress, WD/WN,  Skin:Normal HEENT: normocephalic, conjunctiva/corneas normal, sclerae anicteric, PERRLA, EOMi, nares patent, no discharge or erythema, pharynx normal Oral cavity: MMM, tongue normal, teeth normal Neck: supple, no lymphadenopathy, no thyromegaly, no masses, normal ROM  Heart: RRR, normal S1, S2, no murmurs Lungs: CTA bilaterally, no wheezes, rhonchi, or rales Abdomen: +bs, soft, non tender, non distended, no masses, no hepatomegaly, no splenomegaly,  no bruits Back: non tender, normal ROM, no scoliosis Musculoskeletal: upper extremities non tender, no obvious deformity, normal ROM throughout, lower extremities non tender, no obvious deformity, normal ROM throughout Extremities: no edema, no cyanosis, no clubbing Pulses: 2+ symmetric, upper and lower extremities, normal cap refill Neurological: alert, oriented x 3, CN2-12 intact, strength normal upper extremities and lower extremities, sensation normal throughout, DTRs 2+ throughout, no cerebellar signs, gait normal Psychiatric: normal affect, behavior normal, pleasant     Assessment and Plan :   Need for prophylactic vaccination and inoculation against influenza - Plan: Flu vaccine HIGH DOSE PF (Fluzone High dose)  Need for prophylactic vaccination against Streptococcus pneumoniae (pneumococcus) - Plan: Pneumococcal polysaccharide vaccine 23-valent greater than or equal to 2yo subcutaneous/IM  Chronic atrial fibrillation (HCC) - Plan: CBC with Differential/Platelet, Comprehensive metabolic panel, Lipid panel  Hyperlipidemia, unspecified hyperlipidemia type - Plan: Lipid panel  History of vitamin D deficiency - Plan: VITAMIN D 25 Hydroxy (Vit-D Deficiency, Fractures)  Family history of prostate cancer - Plan: PSA  Glaucoma, unspecified glaucoma type, unspecified laterality  Screening for prostate cancer - Plan: PSA  Low vitamin D level - Plan: VITAMIN D 25 Hydroxy (Vit-D Deficiency, Fractures)    Physical exam - discussed healthy lifestyle, diet, exercise, preventative care, vaccinations, and addressed their concerns.   Follow-up yearly

## 2016-03-25 LAB — VITAMIN D 25 HYDROXY (VIT D DEFICIENCY, FRACTURES): Vit D, 25-Hydroxy: 33 ng/mL (ref 30–100)

## 2016-04-13 DIAGNOSIS — H401132 Primary open-angle glaucoma, bilateral, moderate stage: Secondary | ICD-10-CM | POA: Diagnosis not present

## 2016-07-14 ENCOUNTER — Encounter: Attending: Internal Medicine

## 2016-07-14 ENCOUNTER — Ambulatory Visit: Admit: 2016-07-14 | Discharge: 2016-07-14 | Payer: MEDICARE | Attending: Internal Medicine

## 2016-07-14 DIAGNOSIS — I10 Essential (primary) hypertension: Secondary | ICD-10-CM

## 2016-07-14 NOTE — Progress Notes (Signed)
HISTORY OF PRESENT ILLNESS  Austin Walker is a 66 y.o. male.  HPI     F/u for HTN HLD  Prediabetes  Having pain in stomach--will go to GI clinic and get a colonoscopy   bp has been elevated--candasartan changed to lisinopril  Going to mental health clinic for anxiety and depression , nightmares or bad dreams. No dx PTSD  On URSO for gallstones--no choly since has passed a stone. Had sepsis  Had kidney stone too last year  lipitor  Decreased from 80 to 40 mg qd  Has new cpap machine which is helping his sleep  Retired from work 2 yrs ago      Has b/l wrsit pain d/t tendonitis , no carpal tunnel. Wearing gloves and splints  C/o chronic neck pain since 1970's. No MVA or injury or surgery  Was denied disability from neck pain  Takes muscle relaxer prn  Takes neurontin for right arm pain-numb and tingling at times  [facility]    Last visit:  [facility] and pysch-anxiety  [facility]  Hurts allover in joints and tends to fall more-still rides motorcycle  Due to see primary MD Dr Sherryll BurgerShah  bp has been up a down last few months  Walks for exericse but has knee pain and sometimes uses a cane  Has some numbess and tingling of hands worse at night when sleeping which awakes him from sleep  Notes grip strength is down  Patient Active Problem List    Diagnosis Date Noted   ??? Hypertension with goal blood pressure less than 140/90 10/09/2014   ??? Pure hypercholesterolemia 10/09/2014   ??? Anxiety 10/09/2014   ??? Mild single current episode of major depressive disorder (HCC) 10/09/2014   ??? 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 OSA on CPAP 10/09/2014   ??? DJD (degenerative joint disease) 10/09/2014   ??? Sciatica 10/09/2014   ??? Prediabetes 10/09/2014   ??? Gastroesophageal reflux disease without esophagitis 10/09/2014   ??? TIA (transient ischemic attack) 10/09/2014   ??? Morbid obesity due to excess calories (HCC) 10/09/2014     Current Outpatient Prescriptions    Medication Sig Dispense Refill   ??? prazosin (MINIPRESS) 2 mg capsule Take 2 mg by mouth nightly.     ??? busPIRone (BUSPAR) 10 mg tablet Take 10 mg by mouth three (3) times daily.     ??? candesartan-hydroCHLOROthiazide (ATACAND HCT) 32-25 mg tab per tablet Take 1 Tab by mouth daily. 90 Tab 3   ??? aspirin delayed-release 81 mg tablet Take  by mouth daily.     ??? atorvastatin (LIPITOR) 80 mg tablet Take 80 mg by mouth daily.     ??? capsicum oleoresin 0.025 % topical cream Apply  to affected area three (3) times daily.     ??? carboxymethylcellulose sodium 1 % dlgl Apply  to eye.     ??? fluticasone (FLONASE) 50 mcg/actuation nasal spray 2 Sprays by Both Nostrils route once.     ??? gabapentin (NEURONTIN) 600 mg tablet Take  by mouth two (2) times a day.     ??? ketotifen (ZADITOR) 0.025 % (0.035 %) ophthalmic solution Administer 1 Drop to both eyes two (2) times a day.     ??? loratadine (CLARITIN) 10 mg tablet Take 10 mg by mouth.     ??? naproxen (NAPROSYN) 375 mg tablet Take 375 mg by mouth  two (2) times daily (with meals).     ??? NIFEdipine ER (PROCARDIA XL) 60 mg ER tablet Take 60 mg by mouth daily.     ??? sertraline (ZOLOFT) 100 mg tablet Take 150 mg by mouth.     ??? ketoconazole (NIZORAL) 2 % topical cream Apply  to affected area daily.     ??? Omeprazole delayed release (PRILOSEC D/R) 20 mg tablet Take 20 mg by mouth daily.       No Known Allergies  Social History   Substance Use Topics   ??? Smoking status: Never Smoker   ??? Smokeless tobacco: Not on file   ??? Alcohol use No           ROS    Physical Exam   Constitutional: He appears well-developed and well-nourished. No distress.   Appears stated age   HENT:   Head: Normocephalic.   Neck:   Decreased neck ROPM in all directions  No scar or prior surgery to neck   Cardiovascular: Normal rate, regular rhythm and normal heart sounds.  Exam reveals no gallop and no friction rub.    No murmur heard.  Pulmonary/Chest: Effort normal and breath sounds normal. No respiratory  distress. He has no wheezes. He has no rales. He exhibits no tenderness.   Abdominal: Soft.   Musculoskeletal: He exhibits no edema.   normal b/l wrist rom, no swelling or TTP   Neurological: He is alert.   Psychiatric: He has a normal mood and affect.   Nursing note and vitals reviewed.      ASSESSMENT and PLAN  Diagnoses and all orders for this visit:    1. Hypertension with goal blood pressure less than 140/90   Mild elevation, consider increasing dose of lisinopril   Pt will f/u at [facility]  2. Pure hypercholesterolemia   Lipids managed at [facility] with PCP  3. Anxiety   On zoloft   F/u mental health clinic  4. Elevated glucose    5. Pain in both wrists  -     REFERRAL TO ORTHOPEDIC SURGERY   Tendonitis   Carpal tunnel ruled out   Wrist spints  6. 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  Preventive   Pt will get all screening and immunizations at [facility] today  Follow-up Disposition:  Return in about 1 year (around 07/14/2017) for htn yearly f/u.

## 2016-07-14 NOTE — Progress Notes (Signed)
Chief Complaint   Patient presents with   ??? Complete Physical     1 year   ??? Hypertension     1 year   ??? Wrist Pain     both wrist hurt,not accident related ,6 months or more   ??? Neck Pain     not accident related, causes headaches

## 2016-08-12 DIAGNOSIS — H401132 Primary open-angle glaucoma, bilateral, moderate stage: Secondary | ICD-10-CM | POA: Diagnosis not present

## 2016-12-09 DIAGNOSIS — H401132 Primary open-angle glaucoma, bilateral, moderate stage: Secondary | ICD-10-CM | POA: Diagnosis not present

## 2017-04-06 DIAGNOSIS — H401132 Primary open-angle glaucoma, bilateral, moderate stage: Secondary | ICD-10-CM | POA: Diagnosis not present

## 2017-04-13 ENCOUNTER — Ambulatory Visit: Payer: Medicare Other | Admitting: Family Medicine

## 2017-06-21 ENCOUNTER — Ambulatory Visit (INDEPENDENT_AMBULATORY_CARE_PROVIDER_SITE_OTHER): Payer: Medicare Other | Admitting: Family Medicine

## 2017-06-21 ENCOUNTER — Encounter: Payer: Self-pay | Admitting: Family Medicine

## 2017-06-21 VITALS — BP 150/90 | HR 84 | Ht 69.0 in | Wt 192.0 lb

## 2017-06-21 DIAGNOSIS — H409 Unspecified glaucoma: Secondary | ICD-10-CM | POA: Diagnosis not present

## 2017-06-21 DIAGNOSIS — Z8639 Personal history of other endocrine, nutritional and metabolic disease: Secondary | ICD-10-CM

## 2017-06-21 DIAGNOSIS — E785 Hyperlipidemia, unspecified: Secondary | ICD-10-CM

## 2017-06-21 DIAGNOSIS — I482 Chronic atrial fibrillation, unspecified: Secondary | ICD-10-CM

## 2017-06-21 DIAGNOSIS — Z8042 Family history of malignant neoplasm of prostate: Secondary | ICD-10-CM

## 2017-06-21 DIAGNOSIS — Z125 Encounter for screening for malignant neoplasm of prostate: Secondary | ICD-10-CM | POA: Diagnosis not present

## 2017-06-21 NOTE — Progress Notes (Signed)
Riley Beasley is a 67 y.o. male who presents for annual wellness visit and follow-up on chronic medical conditions.  He has  No particular concerns or complaints.  He does have an underlying history of glaucoma and is being followed for that.  There is also history of prostate cancer and he would like a follow-up PSA.  He also has a history of chronic atrial fibrillation but has not had any chest pain, irregular heartbeat, shortness of breath or weakness.  Does have a history of vitamin D deficiency.  Immunizations and Health Maintenance Immunization History  Administered Date(s) Administered  . DTaP 05/03/2003  . Influenza Split 03/11/2011, 03/15/2012  . Influenza Whole 03/03/2010  . Influenza, High Dose Seasonal PF 03/25/2015, 03/24/2016  . Influenza,inj,Quad PF,6+ Mos 03/17/2013, 03/22/2014  . Pneumococcal Conjugate-13 03/25/2015  . Pneumococcal Polysaccharide-23 03/24/2016  . Tdap 03/11/2011  . Zoster 03/03/2010   Health Maintenance Due  Topic Date Due  . INFLUENZA VACCINE  12/02/2016    Last colonoscopy: feb of 2015 Last PSA: last year Dentist: dec of 2018 Ophtho:  Dec. 2018 Exercise: p90x three to four times a week or one hour  Other doctors caring for patient include:none Advanced Directives:info given    Depression screen:  See questionnaire below.     Depression screen [facility] 2[facility] 06/21/2017 03/24/2016 03/25/2015 03/22/2014 03/17/2013  Decreased Interest 0 0 0 0 0  Down, Depressed, Hopeless 0 0 0 0 0  PHQ - 2 Score 0 0 0 0 0    Fall Screen: See Questionaire below.   Fall Risk  06/21/2017 03/24/2016 03/25/2015 03/25/2015 03/22/2014  Falls in the past year? No No No No No    ADL screen:  See questionnaire below.  Functional Status Survey: Is the patient deaf or have difficulty hearing?: No Does the patient have difficulty seeing, even when wearing glasses/contacts?: No Does the patient have difficulty concentrating, remembering, or making decisions?: No Does the  patient have difficulty walking or climbing stairs?: No Does the patient have difficulty dressing or bathing?: No Does the patient have difficulty doing errands alone such as visiting a doctor's office or shopping?: No   Review of Systems  Constitutional: -, -unexpected weight change, -anorexia, -fatigue Allergy: -sneezing, -itching, -congestion Dermatology: denies changing moles, rash, lumps ENT: -runny nose, -ear pain, -sore throat,  Cardiology:  -chest pain, -palpitations, -orthopnea, Respiratory: -cough, -shortness of breath, -dyspnea on exertion, -wheezing,  Gastroenterology: -abdominal pain, -nausea, -vomiting, -diarrhea, -constipation, -dysphagia Hematology: -bleeding or bruising problems Musculoskeletal: -arthralgias, -myalgias, -joint swelling, -back pain, - Ophthalmology: -vision changes,  Urology: -dysuria, -difficulty urinating,  -urinary frequency, -urgency, incontinence Neurology: -, -numbness, , -memory loss, -falls, -dizziness    PHYSICAL EXAM:   General Appearance: Alert, cooperative, no distress, appears stated age Head: Normocephalic, without obvious abnormality, atraumatic Eyes: PERRL, conjunctiva/corneas clear, EOM's intact, fundi benign Ears: Normal TM's and external ear canals Nose: Nares normal, mucosa normal, no drainage or sinus   tenderness Throat: Lips, mucosa, and tongue normal; teeth and gums normal Neck: Supple, no lymphadenopathy, thyroid:no enlargement/tenderness/nodules; no carotid bruit or JVD Lungs: Clear to auscultation bilaterally without wheezes, rales or ronchi; respirations unlabored Heart: Regular rate and rhythm, S1 and S2 normal, no murmur, rub or gallop Abdomen: Soft, non-tender, nondistended, normoactive bowel sounds, no masses, no hepatosplenomegaly Extremities: No clubbing, cyanosis or edema Pulses: 2+ and symmetric all extremities Skin: Skin color, texture, turgor normal, no rashes or lesions Lymph nodes: Cervical,  supraclavicular, and axillary nodes normal Neurologic: CNII-XII intact, normal strength, sensation and  gait; reflexes 2+ and symmetric throughout   Psych: Normal mood, affect, hygiene and grooming  ASSESSMENT/PLAN: Family history of prostate cancer - Plan: PSA  Glaucoma, unspecified glaucoma type, unspecified laterality  History of vitamin D deficiency - Plan: VITAMIN D 25 Hydroxy (Vit-D Deficiency, Fractures)  Chronic atrial fibrillation (HCC) - Plan: CBC with Differential/Platelet, Comprehensive metabolic panel, Lipid panel  Hyperlipidemia with target low density lipoprotein (LDL) cholesterol less than 100 mg/dL - Plan: Lipid panel  Screening for prostate cancer     , recommended at least 30 minutes of aerobic activity at least 5 days/week; ; healthy diet and alcohol recommendations (less than or equal to 2 drinks/day) reviewed; regular seatbelt use; changing batteries in smoke detectors. Immunization recommendations discussed.  Colonoscopy recommendations reviewed.   Medicare Attestation I have personally reviewed: The patient's medical and social history Their use of alcohol, tobacco or illicit drugs Their current medications and supplements The patient's functional ability including ADLs,fall risks, home safety risks, cognitive, and hearing and visual impairment Diet and physical activities Evidence for depression or mood disorders  The patient's weight, height, and BMI have been recorded in the chart.  I have made referrals, counseling, and provided education to the patient based on review of the above and I have provided the patient with a written personalized care plan for preventive services.     Sharlot Gowda, MD   06/21/2017

## 2017-06-22 LAB — CBC WITH DIFFERENTIAL/PLATELET
BASOS: 0 %
Basophils Absolute: 0 10*3/uL (ref 0.0–0.2)
EOS (ABSOLUTE): 0.1 10*3/uL (ref 0.0–0.4)
EOS: 2 %
HEMATOCRIT: 46.7 % (ref 37.5–51.0)
HEMOGLOBIN: 16 g/dL (ref 13.0–17.7)
IMMATURE GRANULOCYTES: 0 %
Immature Grans (Abs): 0 10*3/uL (ref 0.0–0.1)
Lymphocytes Absolute: 2.2 10*3/uL (ref 0.7–3.1)
Lymphs: 38 %
MCH: 28.7 pg (ref 26.6–33.0)
MCHC: 34.3 g/dL (ref 31.5–35.7)
MCV: 84 fL (ref 79–97)
MONOCYTES: 8 %
MONOS ABS: 0.4 10*3/uL (ref 0.1–0.9)
NEUTROS PCT: 52 %
Neutrophils Absolute: 3 10*3/uL (ref 1.4–7.0)
Platelets: 163 10*3/uL (ref 150–379)
RBC: 5.57 x10E6/uL (ref 4.14–5.80)
RDW: 15 % (ref 12.3–15.4)
WBC: 5.7 10*3/uL (ref 3.4–10.8)

## 2017-06-22 LAB — LIPID PANEL
CHOL/HDL RATIO: 4.3 ratio (ref 0.0–5.0)
Cholesterol, Total: 243 mg/dL — ABNORMAL HIGH (ref 100–199)
HDL: 56 mg/dL (ref >39)
LDL CALC: 170 mg/dL — AB (ref 0–99)
TRIGLYCERIDES: 86 mg/dL (ref 0–149)
VLDL Cholesterol Cal: 17 mg/dL (ref 5–40)

## 2017-06-22 LAB — COMPREHENSIVE METABOLIC PANEL
ALBUMIN: 4.8 g/dL (ref 3.6–4.8)
ALT: 29 IU/L (ref 0–44)
AST: 33 IU/L (ref 0–40)
Albumin/Globulin Ratio: 1.8 (ref 1.2–2.2)
Alkaline Phosphatase: 55 IU/L (ref 39–117)
BUN / CREAT RATIO: 9 — AB (ref 10–24)
BUN: 11 mg/dL (ref 8–27)
Bilirubin Total: 0.8 mg/dL (ref 0.0–1.2)
CALCIUM: 9.7 mg/dL (ref 8.6–10.2)
CO2: 25 mmol/L (ref 20–29)
CREATININE: 1.25 mg/dL (ref 0.76–1.27)
Chloride: 103 mmol/L (ref 96–106)
GFR, EST AFRICAN AMERICAN: 68 mL/min/{1.73_m2} (ref >59)
GFR, EST NON AFRICAN AMERICAN: 59 mL/min/{1.73_m2} — AB (ref >59)
GLOBULIN, TOTAL: 2.7 g/dL (ref 1.5–4.5)
Glucose: 103 mg/dL — ABNORMAL HIGH (ref 65–99)
POTASSIUM: 5.4 mmol/L — AB (ref 3.5–5.2)
SODIUM: 144 mmol/L (ref 134–144)
TOTAL PROTEIN: 7.5 g/dL (ref 6.0–8.5)

## 2017-06-22 LAB — VITAMIN D 25 HYDROXY (VIT D DEFICIENCY, FRACTURES): Vit D, 25-Hydroxy: 40 ng/mL (ref 30.0–100.0)

## 2017-06-22 LAB — PSA: PROSTATE SPECIFIC AG, SERUM: 2.6 ng/mL (ref 0.0–4.0)

## 2017-07-15 ENCOUNTER — Ambulatory Visit: Admit: 2017-07-15 | Payer: MEDICARE | Attending: Internal Medicine

## 2017-07-15 DIAGNOSIS — I10 Essential (primary) hypertension: Secondary | ICD-10-CM

## 2017-07-15 NOTE — Patient Instructions (Addendum)
Medicare Wellness Visit, Male    The best way to live healthy is to have a lifestyle where you eat a well-balanced diet, exercise regularly, limit alcohol use, and quit all forms of tobacco/nicotine, if applicable.   Regular preventive services are another way to keep healthy. Preventive services (vaccines, screening tests, monitoring & exams) can help personalize your care plan, which helps you manage your own care. Screening tests can find health problems at the earliest stages, when they are easiest to treat.   [facility] follows the current, evidence-based guidelines published by the [place] [employer]ventive Services Task Force (USPSTF) when recommending preventive services for our patients. Because we follow these guidelines, sometimes recommendations change over time as research supports it. (For example, a prostate screening blood test is no longer routinely recommended for men with no symptoms.)  Of course, you and your doctor may decide to screen more often for some diseases, based on your risk and co-morbidities (chronic disease you are already diagnosed with).   Preventive services for you include:  - Medicare offers their members a free annual wellness visit, which is time for you and your primary care provider to discuss and plan for your preventive service needs. Take advantage of this benefit every year!  -All adults over age 69 should receive the recommended pneumonia vaccines. Current USPSTF guidelines recommend a series of two vaccines for the best pneumonia protection.   -All adults should have a flu vaccine yearly and an ECG. All adults age 69 and older should receive a shingles vaccine once in their lifetime.    -All adults age 69-70 who are overweight should have a diabetes screening test once every three years.   -Other screening tests & preventive services for persons with diabetes include: an eye exam to screen for diabetic retinopathy, a kidney function  test, a foot exam, and stricter control over your cholesterol.   -Cardiovascular screening for adults with routine risk involves an electrocardiogram (ECG) at intervals determined by the provider.   -Colorectal cancer screening should be done for adults age 69-75 with no increased risk factors for colorectal cancer.  There are a number of acceptable methods of screening for this type of cancer. Each test has its own benefits and drawbacks. Discuss with your provider what is most appropriate for you during your annual wellness visit. The different tests include: colonoscopy (considered the best screening method), a fecal occult blood test, a fecal DNA test, and sigmoidoscopy.  -All adults born between 1945 and 1965 should be screened once for Hepatitis C.  -An Abdominal Aortic Aneurysm (AAA) Screening is recommended for men age 165-75 who has ever smoked in their lifetime.     Here is a list of your current Health Maintenance items (your personalized list of preventive services) with a due date:  Health Maintenance Due   Topic Date Due   ??? Hepatitis C Test  [birth year]   ??? Shingles Vaccine (1 of 2) 04/26/1999   ??? Glaucoma Screening   04/25/2014   ??? Pneumococcal Vaccine (2 of 2 - PPSV23) 05/29/2015   ??? Annual Well Visit  07/15/2016   ??? Flu Vaccine  12/02/2016   ??? DTaP/Tdap/Td  (2 - Td) 12/14/2016

## 2017-07-15 NOTE — Progress Notes (Signed)
HISTORY OF PRESENT ILLNESS  Austin Walker is a 67 y.o. male.  HPI   ??  F/u for HTN HLD ??Prediabetes  OSA, chronic neck pain and medicare wellness-------------------  Pt is seen at [facility] clinic for primary care and mental health clinic for anxiety and depression  Had egd /colonsocopy since last OV--duodenal erosion, tics, hemorrhoids--repeat 10 yrs  Had lower abdominal pain -but colonoscopy was ok. Pain is better  Sees psych MD again soon --buspar helps  Has cyst on right side--skin tag   Has chronic low back but but usually no sciatica    Gets labs at least once per year at [facility] per pt-has prediabetes    Last OV  Having pain in stomach--will go to GI clinic and get a colonoscopy   bp has been elevated--candasartan changed to lisinopril  Going to mental health clinic for anxiety and depression , nightmares or bad dreams. No dx PTSD  On URSO for gallstones--no choly since has passed a stone. Had sepsis  Had kidney stone too last year  lipitor  Decreased from 80 to 40 mg qd  Has new cpap machine which is helping his sleep  Retired from work 2 yrs ago  ??  ??  Has b/l wrsit pain d/t tendonitis , no carpal tunnel. Wearing gloves and splints  C/o chronic neck pain since 1970's. No MVA or injury or surgery  Was denied disability from neck pain  Takes muscle relaxer prn  Takes neurontin for right arm pain-numb and tingling at times  a1c was 6.5 in 2016 from [facility] records  ??  Last visit:  Goes to [facility] q3-6 months for primary care and pysch-anxiety  Had flu shot and prevar 13 at [facility]  last year  Hurts allover in joints and tends to fall more-still rides motorcycle  Due to see primary MD Dr Manuella Ghazi  bp has been up a down last few months  Walks for exericse but has knee pain and sometimes uses a cane  Has some numbess and tingling of hands worse at night when sleeping which awakes him from sleep        Patient Active Problem List    Diagnosis Date Noted   ??? Hypertension with goal blood pressure less than 140/90 10/09/2014    ??? Pure hypercholesterolemia 10/09/2014   ??? Anxiety 10/09/2014   ??? Mild single current episode of major depressive disorder ([place]) 10/09/2014   ??? 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 OSA on CPAP 10/09/2014   ??? DJD (degenerative joint disease) 10/09/2014   ??? Sciatica 10/09/2014   ??? Prediabetes 10/09/2014   ??? Gastroesophageal reflux disease without esophagitis 10/09/2014   ??? TIA (transient ischemic attack) 10/09/2014   ??? Morbid obesity due to excess calories ([place]) 10/09/2014     Current Outpatient Medications   Medication Sig Dispense Refill   ??? lidocaine (XYLOCAINE) 5 % ointment Apply  to affected area as needed for Pain.     ??? cholecalciferol (VITAMIN D3) 1,000 unit tablet Take  by mouth daily.     ??? methocarbamol (ROBAXIN) 500 mg tablet Take  by mouth four (4) times daily.     ??? ursodiol (ACTIGALL) 300 mg capsule Take 300 mg by mouth two (2) times a day.     ??? meclizine (ANTIVERT) 25 mg chewable tablet Take  by mouth three (3) times daily as needed.     ??? lisinopril (PRINIVIL, ZESTRIL) 20 mg tablet Take  by mouth daily.     ??? cpap  machine kit by Does Not Apply route.     ??? prazosin (MINIPRESS) 2 mg capsule Take 2 mg by mouth nightly.     ??? busPIRone (BUSPAR) 10 mg tablet Take 10 mg by mouth daily.     ??? candesartan-hydroCHLOROthiazide (ATACAND HCT) 32-25 mg tab per tablet Take 1 Tab by mouth daily. 90 Tab 3   ??? aspirin delayed-release 81 mg tablet Take  by mouth daily.     ??? atorvastatin (LIPITOR) 80 mg tablet Take 80 mg by mouth daily. 1/2 tab     ??? capsicum oleoresin 0.025 % topical cream Apply  to affected area three (3) times daily.     ??? carboxymethylcellulose sodium 1 % dlgl Apply  to eye.     ??? fluticasone (FLONASE) 50 mcg/actuation nasal spray 2 Sprays by Both Nostrils route once.     ??? gabapentin (NEURONTIN) 600 mg tablet Take  by mouth two (2) times a day.     ??? ketotifen (ZADITOR) 0.025 % (0.035 %) ophthalmic solution Administer 1 Drop to both eyes two (2) times a day.      ??? loratadine (CLARITIN) 10 mg tablet Take 10 mg by mouth.     ??? naproxen (NAPROSYN) 375 mg tablet Take 375 mg by mouth two (2) times daily (with meals).     ??? NIFEdipine ER (PROCARDIA XL) 60 mg ER tablet Take 30 mg by mouth daily.     ??? sertraline (ZOLOFT) 100 mg tablet Take 150 mg by mouth.     ??? ketoconazole (NIZORAL) 2 % topical cream Apply  to affected area daily.     ??? Omeprazole delayed release (PRILOSEC D/R) 20 mg tablet Take 20 mg by mouth daily.       No Known Allergies  Social History     Tobacco Use   ??? Smoking status: Never Smoker   ??? Smokeless tobacco: Never Used   Substance Use Topics   ??? Alcohol use: No      No results found for: HBA1C, HBA1CEXT, HGBE8, GLU, GESTF, GLUCPOC, MCACR, MCA1, MCA2, MCA3, MCAU, LDL, LDLC, DLDLP, CRES, CREAPOC, ACREA, CREA, REFC3, REFC4, HBA1CEXT   No results found for: CHOL, CHOLPOCT, HDL, LDL, LDLC, LDLCPOC, LDLCEXT, TRIGL, TGLPOCT, CHHD, CHHDX  No results found for: GFRNA, GFRNAPOC, GFRAA, GFRAAPOC, CREA, CREAPOC, BUN, IBUN, BUNPOC, NA, NAPOC, K, KPOCT, CL, CLPOC, CO2, CO2POC, MG, PHOS, ALBEU, PTH, PTHILT, EPO   ROS    Physical Exam   Constitutional: He appears well-developed and well-nourished. No distress.   Appears stated age, obese, nad   HENT:   Head: Normocephalic.   Cardiovascular: Normal rate, regular rhythm and normal heart sounds. Exam reveals no gallop and no friction rub.   No murmur heard.  Pulmonary/Chest: Effort normal and breath sounds normal. No respiratory distress. He has no wheezes. He has no rales. He exhibits no tenderness.   Abdominal: Soft. He exhibits no distension and no mass. There is no tenderness. There is no rebound and no guarding.   obese   Musculoskeletal: He exhibits no edema.   No TTP across lower back   Neurological: He is alert.   Psychiatric: He has a normal mood and affect.   Nursing note and vitals reviewed.      ASSESSMENT and PLAN  Diagnoses and all orders for this visit:    1. Essential hypertension     2. Pure hypercholesterolemia    3. Prediabetes    4. Chronic low back pain without sciatica, unspecified back pain  laterality      Follow-up Disposition:  Return in about 1 year (around 07/16/2018) for cpe.   This is the Subsequent Medicare Annual Wellness Exam, performed 12 months or more after the Initial AWV or the last Subsequent AWV    I have reviewed the patient's medical history in detail and updated the computerized patient record.     History   No past medical history on file.   Past Surgical History:   Procedure Laterality Date   ??? HX TONSILLECTOMY     ??? HX UROLOGICAL      sx at [facility] due to inabilitly to void at 5-6     Current Outpatient Medications   Medication Sig Dispense Refill   ??? lidocaine (XYLOCAINE) 5 % ointment Apply  to affected area as needed for Pain.     ??? cholecalciferol (VITAMIN D3) 1,000 unit tablet Take  by mouth daily.     ??? methocarbamol (ROBAXIN) 500 mg tablet Take  by mouth four (4) times daily.     ??? ursodiol (ACTIGALL) 300 mg capsule Take 300 mg by mouth two (2) times a day.     ??? meclizine (ANTIVERT) 25 mg chewable tablet Take  by mouth three (3) times daily as needed.     ??? lisinopril (PRINIVIL, ZESTRIL) 20 mg tablet Take 40 mg by mouth daily.     ??? busPIRone (BUSPAR) 10 mg tablet Take 10 mg by mouth two (2) times a day.     ??? aspirin delayed-release 81 mg tablet Take  by mouth daily.     ??? atorvastatin (LIPITOR) 80 mg tablet Take 80 mg by mouth daily. 1/2 tab     ??? capsicum oleoresin 0.025 % topical cream Apply  to affected area three (3) times daily.     ??? carboxymethylcellulose sodium 1 % dlgl Apply  to eye.     ??? fluticasone (FLONASE) 50 mcg/actuation nasal spray 2 Sprays by Both Nostrils route once.     ??? loratadine (CLARITIN) 10 mg tablet Take 10 mg by mouth.     ??? naproxen (NAPROSYN) 375 mg tablet Take 375 mg by mouth two (2) times daily (with meals).     ??? NIFEdipine ER (PROCARDIA XL) 60 mg ER tablet Take 30 mg by mouth daily.      ??? ketoconazole (NIZORAL) 2 % topical cream Apply  to affected area daily.     ??? Omeprazole delayed release (PRILOSEC D/R) 20 mg tablet Take 20 mg by mouth daily.     ??? cpap machine kit by Does Not Apply route.     ??? prazosin (MINIPRESS) 2 mg capsule Take 2 mg by mouth nightly.     ??? candesartan-hydroCHLOROthiazide (ATACAND HCT) 32-25 mg tab per tablet Take 1 Tab by mouth daily. 90 Tab 3   ??? ketotifen (ZADITOR) 0.025 % (0.035 %) ophthalmic solution Administer 1 Drop to both eyes two (2) times a day.     ??? sertraline (ZOLOFT) 100 mg tablet Take 150 mg by mouth.       No Known Allergies  Family History   Problem Relation Age of Onset   ??? Lung Disease Mother      Social History     Tobacco Use   ??? Smoking status: Never Smoker   ??? Smokeless tobacco: Never Used   Substance Use Topics   ??? Alcohol use: No     Patient Active Problem List   Diagnosis Code   ??? Hypertension with goal blood  pressure less than 140/90 I10   ??? Pure hypercholesterolemia E78.00   ??? Anxiety F41.9   ??? Mild single current episode of major depressive disorder (HCC) F32.0   ??? Primary insomnia F51.01   ??? OSA on CPAP G47.33, Z99.89   ??? DJD (degenerative joint disease) M19.90   ??? Sciatica M54.30   ??? Prediabetes R73.03   ??? Gastroesophageal reflux disease without esophagitis K21.9   ??? TIA (transient ischemic attack) G45.9   ??? Morbid obesity due to excess calories (HCC) E66.01       Depression Risk Factor Screening:     3 most recent PHQ Screens 10/09/2014   Little interest or pleasure in doing things More than half the days   Feeling down, depressed, irritable, or hopeless More than half the days   Total Score PHQ 2 4   Trouble falling or staying asleep, or sleeping too much More than half the days   Feeling tired or having little energy Nearly every day   Poor appetite, weight loss, or overeating More than half the days   Feeling bad about yourself - or that you are a failure or have let yourself or your family down More than half the days    Trouble concentrating on things such as school, work, reading, or watching TV More than half the days   Moving or speaking so slowly that other people could have noticed; or the opposite being so fidgety that others notice Several days   Thoughts of being better off dead, or hurting yourself in some way Not at all   PHQ 9 Score 16   How difficult have these problems made it for you to do your work, take care of your home and get along with others Very difficult     Alcohol Risk Factor Screening:   You do not drink alcohol or very rarely.    Functional Ability and Level of Safety:   Hearing Loss  Hearing is good.    Activities of Daily Living  The home contains: grab bars  Patient does total self care    Fall Risk  Fall Risk Assessment, last 12 mths 07/15/2017   Able to walk? Yes   Fall in past 12 months? No       Abuse Screen  Patient is not abused    Cognitive Screening   Evaluation of Cognitive Function:  Has your family/caregiver stated any concerns about your memory: no  Normal, Animal Naming test, Sweet 16 test    Patient Care Team   No care team member to display    Assessment/Plan   Education and counseling provided:  Are appropriate based on today's review and evaluation  End-of-Life planning (with patient's consent)-has AMD at home, advised pt to bring in a copy  [facility] immunizations at [facility] per pt    HTN   controlled  HLD   Continue statin   Labs done at [facility]-controlled per pt    Prediabetes   Advised weight reduction   F/u [facility] for labs    Chronic low back pain   Tylenol, nsaids prn    Health Maintenance Due   Topic Date Due   ??? Hepatitis C Screening  03-15-1951   ??? Shingrix Vaccine Age 42> (1 of 2) 04/26/1999   ??? GLAUCOMA SCREENING Q2Y  04/25/2014   ??? Pneumococcal 65+ Low/Medium Risk (2 of 2 - PPSV23) 05/29/2015   ??? MEDICARE YEARLY EXAM  07/15/2016   ??? Influenza Age 53 to Adult  12/02/2016   ???  DTaP/Tdap/Td series (2 - Td) 12/14/2016

## 2017-07-15 NOTE — Progress Notes (Signed)
Chief Complaint   Patient presents with   ??? Annual Exam     1 year

## 2017-08-04 DIAGNOSIS — H401132 Primary open-angle glaucoma, bilateral, moderate stage: Secondary | ICD-10-CM | POA: Diagnosis not present

## 2017-12-15 DIAGNOSIS — H401132 Primary open-angle glaucoma, bilateral, moderate stage: Secondary | ICD-10-CM | POA: Diagnosis not present

## 2018-01-31 DIAGNOSIS — Z23 Encounter for immunization: Secondary | ICD-10-CM | POA: Diagnosis not present

## 2018-04-13 DIAGNOSIS — H401132 Primary open-angle glaucoma, bilateral, moderate stage: Secondary | ICD-10-CM | POA: Diagnosis not present

## 2018-06-23 ENCOUNTER — Ambulatory Visit (INDEPENDENT_AMBULATORY_CARE_PROVIDER_SITE_OTHER): Payer: Medicare Other | Admitting: Family Medicine

## 2018-06-23 ENCOUNTER — Encounter: Payer: Self-pay | Admitting: Family Medicine

## 2018-06-23 VITALS — BP 126/82 | HR 72 | Temp 97.4°F | Ht 69.0 in | Wt 182.4 lb

## 2018-06-23 DIAGNOSIS — Z8639 Personal history of other endocrine, nutritional and metabolic disease: Secondary | ICD-10-CM

## 2018-06-23 DIAGNOSIS — Z8042 Family history of malignant neoplasm of prostate: Secondary | ICD-10-CM

## 2018-06-23 DIAGNOSIS — Z0000 Encounter for general adult medical examination without abnormal findings: Secondary | ICD-10-CM

## 2018-06-23 DIAGNOSIS — E785 Hyperlipidemia, unspecified: Secondary | ICD-10-CM | POA: Diagnosis not present

## 2018-06-23 DIAGNOSIS — I482 Chronic atrial fibrillation, unspecified: Secondary | ICD-10-CM | POA: Diagnosis not present

## 2018-06-23 DIAGNOSIS — H409 Unspecified glaucoma: Secondary | ICD-10-CM | POA: Diagnosis not present

## 2018-06-23 DIAGNOSIS — Z125 Encounter for screening for malignant neoplasm of prostate: Secondary | ICD-10-CM

## 2018-06-23 NOTE — Progress Notes (Signed)
Riley Beasley is a 68 y.o. male who presents for annual wellness visit and follow-up on chronic medical conditions.  He has no particular concerns or complaints.  He does have a previous history of chronic atrial fibrillation and has been quite stable for several years.  He also has hyperlipidemia and does use fish oil.  He is on medication for his glaucoma.  There is also family history of prostate cancer.  He has a history of vitamin D deficiency but has been on supplements.  Otherwise he has no particular concerns or complaints.  Family and social history as well as health maintenance and immunizations was reviewed He keeps himself in excellent shape.  Immunizations and Health Maintenance Immunization History  Administered Date(s) Administered  . DTaP 05/03/2003  . Influenza Split 03/11/2011, 03/15/2012  . Influenza Whole 03/03/2010  . Influenza, High Dose Seasonal PF 03/25/2015, 03/24/2016  . Influenza,inj,Quad PF,6+ Mos 03/17/2013, 03/22/2014  . Pneumococcal Conjugate-13 03/25/2015  . Pneumococcal Polysaccharide-23 03/24/2016  . Tdap 03/11/2011  . Zoster 03/03/2010   Health Maintenance Due  Topic Date Due  . INFLUENZA VACCINE  12/02/2017    Last colonoscopy: 06-08-2013 Last PSA:06-21-17 Dentist: 05/2017 Ophtho:04/2018 Exercise: five days a week  Other doctors caring for patient include: no other doctors  Advanced Directives: Not discussed    Depression screen:  See questionnaire below.     Depression screen [facility] 2/9 06/23/2018 06/21/2017 03/24/2016 03/25/2015 03/22/2014  Decreased Interest 0 0 0 0 0  Down, Depressed, Hopeless 0 0 0 0 0  PHQ - 2 Score 0 0 0 0 0    Fall Screen: See Questionaire below.   Fall Risk  06/23/2018 06/21/2017 03/24/2016 03/25/2015 03/25/2015  Falls in the past year? 1 No No No No    ADL screen:  See questionnaire below.  Functional Status Survey: Is the patient deaf or have difficulty hearing?: No Does the patient have difficulty seeing, even when  wearing glasses/contacts?: No Does the patient have difficulty concentrating, remembering, or making decisions?: No Does the patient have difficulty walking or climbing stairs?: No Does the patient have difficulty dressing or bathing?: No Does the patient have difficulty doing errands alone such as visiting a doctor's office or shopping?: No   Review of Systems  Constitutional: -, -unexpected weight change, -anorexia, -fatigue Allergy: -sneezing, -itching, -congestion Dermatology: denies changing moles, rash, lumps ENT: -runny nose, -ear pain, -sore throat,  Cardiology:  -chest pain, -palpitations, -orthopnea, Respiratory: -cough, -shortness of breath, -dyspnea on exertion, -wheezing,  Gastroenterology: -abdominal pain, -nausea, -vomiting, -diarrhea, -constipation, -dysphagia Hematology: -bleeding or bruising problems Musculoskeletal: -arthralgias, -myalgias, -joint swelling, -back pain, - Ophthalmology: -vision changes,  Urology: -dysuria, -difficulty urinating,  -urinary frequency, -urgency, incontinence Neurology: -, -numbness, , -memory loss, -falls, -dizziness    PHYSICAL EXAM:  BP 126/82 (BP Location: Left Arm, Patient Position: Sitting)   Pulse 72   Temp (!) 97.4 F (36.3 C)   Ht 5\' 9"  (1.753 m)   Wt 182 lb 6.4 oz (82.7 kg)   SpO2 99%   BMI 26.94 kg/m   General Appearance: Alert, cooperative, no distress, appears stated age Head: Normocephalic, without obvious abnormality, atraumatic Eyes: PERRL, conjunctiva/corneas clear, EOM's intact, fundi benign Ears: Normal TM's and external ear canals Nose: Nares normal, mucosa normal, no drainage or sinus   tenderness Throat: Lips, mucosa, and tongue normal; teeth and gums normal Neck: Supple, no lymphadenopathy, thyroid:no enlargement/tenderness/nodules; no carotid bruit or JVD Lungs: Clear to auscultation bilaterally without wheezes, rales or ronchi; respirations unlabored Heart:  Regular rate and rhythm, S1 and S2 normal,  no murmur, rub or gallop Abdomen: Soft, non-tender, nondistended, normoactive bowel sounds, no masses, no hepatosplenomegaly Extremities: No clubbing, cyanosis or edema Pulses: 2+ and symmetric all extremities Skin: Skin color, texture, turgor normal, no rashes or lesions Lymph nodes: Cervical, supraclavicular, and axillary nodes normal Neurologic: CNII-XII intact, normal strength, sensation and gait; reflexes 2+ and symmetric throughout   Psych: Normal mood, affect, hygiene and grooming  ASSESSMENT/PLAN: Routine general medical examination at a health care facility - Plan: POCT Urinalysis DIP (Proadvantage Device), CBC with Differential/Platelet, Comprehensive metabolic panel, Lipid panel  Chronic atrial fibrillation - Plan: POCT Urinalysis DIP (Proadvantage Device), CBC with Differential/Platelet, Comprehensive metabolic panel  Family history of prostate cancer - Plan: PSA, PSA  Glaucoma, unspecified glaucoma type, unspecified laterality  History of vitamin D deficiency - Plan: VITAMIN D 25 Hydroxy (Vit-D Deficiency, Fractures)  Hyperlipidemia with target low density lipoprotein (LDL) cholesterol less than 100 mg/dL - Plan: Lipid panel  Screening for prostate cancer - Plan: PSA, PSA Encouraged him to continue to take excellent care of himself. Discussed PSA screening (risks/benefits), recommended at least 30 minutes of aerobic activity at least 5 days/week;  Immunization recommendations discussed.  Colonoscopy recommendations reviewed.   Medicare Attestation I have personally reviewed: The patient's medical and social history Their use of alcohol, tobacco or illicit drugs Their current medications and supplements The patient's functional ability including ADLs,fall risks, home safety risks, cognitive, and hearing and visual impairment Diet and physical activities Evidence for depression or mood disorders  The patient's weight, height, and BMI have been recorded in the chart.  I  have made referrals, counseling, and provided education to the patient based on review of the above and I have provided the patient with a written personalized care plan for preventive services.     Sharlot Gowda, MD   06/23/2018

## 2018-06-24 LAB — POCT URINALYSIS DIP (PROADVANTAGE DEVICE)
BILIRUBIN UA: NEGATIVE mg/dL
Bilirubin, UA: NEGATIVE
Glucose, UA: NEGATIVE mg/dL
Leukocytes, UA: NEGATIVE
Nitrite, UA: NEGATIVE
PH UA: 6 (ref 5.0–8.0)
RBC UA: NEGATIVE
SPECIFIC GRAVITY, URINE: 1.025
UUROB: 3.5

## 2018-06-24 LAB — CBC WITH DIFFERENTIAL/PLATELET
BASOS ABS: 0 10*3/uL (ref 0.0–0.2)
Basos: 1 %
EOS (ABSOLUTE): 0.2 10*3/uL (ref 0.0–0.4)
Eos: 2 %
HEMOGLOBIN: 16.2 g/dL (ref 13.0–17.7)
Hematocrit: 47.2 % (ref 37.5–51.0)
Immature Grans (Abs): 0 10*3/uL (ref 0.0–0.1)
Immature Granulocytes: 0 %
Lymphocytes Absolute: 2.6 10*3/uL (ref 0.7–3.1)
Lymphs: 34 %
MCH: 27.6 pg (ref 26.6–33.0)
MCHC: 34.3 g/dL (ref 31.5–35.7)
MCV: 81 fL (ref 79–97)
MONOS ABS: 0.6 10*3/uL (ref 0.1–0.9)
Monocytes: 8 %
NEUTROS ABS: 4.2 10*3/uL (ref 1.4–7.0)
Neutrophils: 55 %
PLATELETS: 174 10*3/uL (ref 150–450)
RBC: 5.86 x10E6/uL — ABNORMAL HIGH (ref 4.14–5.80)
RDW: 13.2 % (ref 11.6–15.4)
WBC: 7.6 10*3/uL (ref 3.4–10.8)

## 2018-06-24 LAB — PSA: Prostate Specific Ag, Serum: 2.4 ng/mL (ref 0.0–4.0)

## 2018-06-24 LAB — COMPREHENSIVE METABOLIC PANEL
ALBUMIN: 4.8 g/dL (ref 3.8–4.8)
ALT: 33 IU/L (ref 0–44)
AST: 37 IU/L (ref 0–40)
Albumin/Globulin Ratio: 1.8 (ref 1.2–2.2)
Alkaline Phosphatase: 69 IU/L (ref 39–117)
BILIRUBIN TOTAL: 0.9 mg/dL (ref 0.0–1.2)
BUN / CREAT RATIO: 14 (ref 10–24)
BUN: 16 mg/dL (ref 8–27)
CO2: 24 mmol/L (ref 20–29)
Calcium: 9.6 mg/dL (ref 8.6–10.2)
Chloride: 102 mmol/L (ref 96–106)
Creatinine, Ser: 1.17 mg/dL (ref 0.76–1.27)
GFR calc Af Amer: 73 mL/min/{1.73_m2} (ref >59)
GFR calc non Af Amer: 63 mL/min/{1.73_m2} (ref >59)
GLUCOSE: 93 mg/dL (ref 65–99)
Globulin, Total: 2.6 g/dL (ref 1.5–4.5)
Potassium: 4.9 mmol/L (ref 3.5–5.2)
SODIUM: 144 mmol/L (ref 134–144)
Total Protein: 7.4 g/dL (ref 6.0–8.5)

## 2018-06-24 LAB — LIPID PANEL
CHOL/HDL RATIO: 4.3 ratio (ref 0.0–5.0)
Cholesterol, Total: 239 mg/dL — ABNORMAL HIGH (ref 100–199)
HDL: 55 mg/dL (ref >39)
LDL CALC: 169 mg/dL — AB (ref 0–99)
TRIGLYCERIDES: 75 mg/dL (ref 0–149)
VLDL Cholesterol Cal: 15 mg/dL (ref 5–40)

## 2018-06-24 LAB — VITAMIN D 25 HYDROXY (VIT D DEFICIENCY, FRACTURES): Vit D, 25-Hydroxy: 32.8 ng/mL (ref 30.0–100.0)

## 2018-07-18 ENCOUNTER — Ambulatory Visit: Attending: Internal Medicine

## 2018-07-18 ENCOUNTER — Ambulatory Visit: Admit: 2018-07-18 | Payer: MEDICARE | Attending: Internal Medicine

## 2018-07-18 DIAGNOSIS — I10 Essential (primary) hypertension: Secondary | ICD-10-CM

## 2018-07-18 NOTE — Progress Notes (Signed)
HISTORY OF PRESENT ILLNESS  Austin Walker is a 68 y.o. male.  HPI     ??  F/u for HTN HLD ??Prediabetes  OSA, chronic neck pain and medicare wellness-------------------  Sees MD at [facility] primary care clinic and psych D for anxiety and MDD  Sees primary Md q 6 mos at [facility]  Had septal deviation sx and had GB removal -lap choly last month  Due for pneumovax 23 and flu shot--done at [facility] per pt  Had colonoscopy last year-tics, no polyps--repeat in 10 yrs-Dr Trellis Paganini pysch MD at [facility] for anxiety and MDD  Still takes URSO ?? Pt has called [place] [facility] MD at [facility]  Has sore black raised lesion on right upper arm x 2 years    Last OVC  Pt is seen at [facility] clinic for primary care and mental health clinic for anxiety and depression  Had egd /colonsocopy since last OV--duodenal erosion, tics, hemorrhoids--repeat 10 yrs  Had lower abdominal pain -but colonoscopy was ok. Pain is better  Sees psych MD again soon --buspar helps  Has cyst on right side--skin tag   Has chronic low back but but usually no sciatica  ??  Gets labs at least once per year at [facility] per pt-has prediabetes    Patient Active Problem List    Diagnosis Date Noted   ??? Hypertension with goal blood pressure less than 140/90 10/09/2014   ??? Pure hypercholesterolemia 10/09/2014   ??? Anxiety 10/09/2014   ??? Mild single current episode of major depressive disorder ([place]) 10/09/2014   ??? 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 OSA on CPAP 10/09/2014   ??? DJD (degenerative joint disease) 10/09/2014   ??? Sciatica 10/09/2014   ??? Prediabetes 10/09/2014   ??? Gastroesophageal reflux disease without esophagitis 10/09/2014   ??? TIA (transient ischemic attack) 10/09/2014   ??? Morbid obesity due to excess calories ([place]) 10/09/2014     Current Outpatient Medications   Medication Sig Dispense Refill   ??? lidocaine (XYLOCAINE) 5 % ointment Apply  to affected area as needed for Pain.     ??? ursodiol (ACTIGALL) 300 mg capsule Take 300 mg by mouth two (2) times a day.      ??? meclizine (ANTIVERT) 25 mg chewable tablet Take  by mouth three (3) times daily as needed.     ??? lisinopril (PRINIVIL, ZESTRIL) 20 mg tablet Take 40 mg by mouth daily.     ??? cpap machine kit by Does Not Apply route.     ??? aspirin delayed-release 81 mg tablet Take  by mouth daily.     ??? atorvastatin (LIPITOR) 80 mg tablet Take 80 mg by mouth daily. 1/2 tab     ??? capsicum oleoresin 0.025 % topical cream Apply  to affected area three (3) times daily.     ??? carboxymethylcellulose sodium 1 % dlgl Apply  to eye.     ??? fluticasone (FLONASE) 50 mcg/actuation nasal spray 2 Sprays by Both Nostrils route once.     ??? ketotifen (ZADITOR) 0.025 % (0.035 %) ophthalmic solution Administer 1 Drop to both eyes two (2) times a day.     ??? loratadine (CLARITIN) 10 mg tablet Take 10 mg by mouth.     ??? NIFEdipine ER (PROCARDIA XL) 60 mg ER tablet Take 30 mg by mouth daily.     ??? ketoconazole (NIZORAL) 2 % topical cream Apply  to affected area daily.     ??? Omeprazole delayed release (PRILOSEC D/R)  20 mg tablet Take 20 mg by mouth daily.     ??? cholecalciferol (VITAMIN D3) 1,000 unit tablet Take  by mouth daily.     ??? methocarbamol (ROBAXIN) 500 mg tablet Take  by mouth four (4) times daily.     ??? prazosin (MINIPRESS) 2 mg capsule Take 2 mg by mouth nightly.     ??? busPIRone (BUSPAR) 10 mg tablet Take 10 mg by mouth two (2) times a day.     ??? candesartan-hydroCHLOROthiazide (ATACAND HCT) 32-25 mg tab per tablet Take 1 Tab by mouth daily. 90 Tab 3   ??? naproxen (NAPROSYN) 375 mg tablet Take 375 mg by mouth two (2) times daily (with meals).     ??? sertraline (ZOLOFT) 100 mg tablet Take 150 mg by mouth.       No Known Allergies  Social History     Tobacco Use   ??? Smoking status: Never Smoker   ??? Smokeless tobacco: Never Used   Substance Use Topics   ??? Alcohol use: No           Review of Systems   Constitutional: Negative for chills, fever, malaise/fatigue and weight loss.   Eyes: Negative for blurred vision and double vision.    Respiratory: Negative for cough and shortness of breath.    Cardiovascular: Negative for chest pain and palpitations.   Gastrointestinal: Negative for abdominal pain, blood in stool, constipation, diarrhea, melena, nausea and vomiting.   Genitourinary: Negative for dysuria, frequency, hematuria and urgency.   Musculoskeletal: Negative for back pain, falls, joint pain and myalgias.   Neurological: Negative for dizziness, tremors and headaches.       Physical Exam  Vitals signs and nursing note reviewed.   Constitutional:       General: He is not in acute distress.     Appearance: He is well-developed.      Comments: Appears stated age   HENT:      Head: Normocephalic.   Cardiovascular:      Rate and Rhythm: Normal rate and regular rhythm.      Heart sounds: Normal heart sounds.   Pulmonary:      Effort: Pulmonary effort is normal.      Breath sounds: Normal breath sounds.   Abdominal:      Palpations: Abdomen is soft.   Skin:         Neurological:      Mental Status: He is alert.         ASSESSMENT and PLAN  Diagnoses and all orders for this visit:    1. Essential hypertension    2. Pure hypercholesterolemia    3. Prediabetes    4. Prostate cancer screening    5. Anxiety    6. Encounter for hepatitis C screening test for low risk patient    7. Skin lesion of right arm  -     REFERRAL TO DERMATOLOGY    8. Medicare annual wellness visit, subsequent      Follow-up and Dispositions    ?? Return in about 1 year (around 07/18/2019) for cpe.        This is the Subsequent Medicare Annual Wellness Exam, performed 12 months or more after the Initial AWV or the last Subsequent AWV    I have reviewed the patient's medical history in detail and updated the computerized patient record.     History     Patient Active Problem List   Diagnosis Code   ??? Hypertension with  goal blood pressure less than 140/90 I10   ??? Pure hypercholesterolemia E78.00   ??? Anxiety F41.9    ??? Mild single current episode of major depressive disorder (HCC) F32.0   ??? Primary insomnia F51.01   ??? OSA on CPAP G47.33, Z99.89   ??? DJD (degenerative joint disease) M19.90   ??? Sciatica M54.30   ??? Prediabetes R73.03   ??? Gastroesophageal reflux disease without esophagitis K21.9   ??? TIA (transient ischemic attack) G45.9   ??? Morbid obesity due to excess calories (HCC) E66.01     No past medical history on file.   Past Surgical History:   Procedure Laterality Date   ??? HX TONSILLECTOMY     ??? HX UROLOGICAL      sx at [facility] due to inabilitly to void at 5-6     Current Outpatient Medications   Medication Sig Dispense Refill   ??? lidocaine (XYLOCAINE) 5 % ointment Apply  to affected area as needed for Pain.     ??? ursodiol (ACTIGALL) 300 mg capsule Take 300 mg by mouth two (2) times a day.     ??? meclizine (ANTIVERT) 25 mg chewable tablet Take  by mouth three (3) times daily as needed.     ??? lisinopril (PRINIVIL, ZESTRIL) 20 mg tablet Take 40 mg by mouth daily.     ??? cpap machine kit by Does Not Apply route.     ??? aspirin delayed-release 81 mg tablet Take  by mouth daily.     ??? atorvastatin (LIPITOR) 80 mg tablet Take 80 mg by mouth daily. 1/2 tab     ??? capsicum oleoresin 0.025 % topical cream Apply  to affected area three (3) times daily.     ??? carboxymethylcellulose sodium 1 % dlgl Apply  to eye.     ??? fluticasone (FLONASE) 50 mcg/actuation nasal spray 2 Sprays by Both Nostrils route once.     ??? ketotifen (ZADITOR) 0.025 % (0.035 %) ophthalmic solution Administer 1 Drop to both eyes two (2) times a day.     ??? loratadine (CLARITIN) 10 mg tablet Take 10 mg by mouth.     ??? NIFEdipine ER (PROCARDIA XL) 60 mg ER tablet Take 30 mg by mouth daily.     ??? ketoconazole (NIZORAL) 2 % topical cream Apply  to affected area daily.     ??? Omeprazole delayed release (PRILOSEC D/R) 20 mg tablet Take 20 mg by mouth daily.     ??? cholecalciferol (VITAMIN D3) 1,000 unit tablet Take  by mouth daily.      ??? methocarbamol (ROBAXIN) 500 mg tablet Take  by mouth four (4) times daily.     ??? prazosin (MINIPRESS) 2 mg capsule Take 2 mg by mouth nightly.     ??? busPIRone (BUSPAR) 10 mg tablet Take 10 mg by mouth two (2) times a day.     ??? candesartan-hydroCHLOROthiazide (ATACAND HCT) 32-25 mg tab per tablet Take 1 Tab by mouth daily. 90 Tab 3   ??? naproxen (NAPROSYN) 375 mg tablet Take 375 mg by mouth two (2) times daily (with meals).     ??? sertraline (ZOLOFT) 100 mg tablet Take 150 mg by mouth.       No Known Allergies    Family History   Problem Relation Age of Onset   ??? Lung Disease Mother      Social History     Tobacco Use   ??? Smoking status: Never Smoker   ??? Smokeless tobacco: Never  Used   Substance Use Topics   ??? Alcohol use: No       Depression Risk Factor Screening:     3 most recent PHQ Screens 10/09/2014   Little interest or pleasure in doing things More than half the days   Feeling down, depressed, irritable, or hopeless More than half the days   Total Score PHQ 2 4   Trouble falling or staying asleep, or sleeping too much More than half the days   Feeling tired or having little energy Nearly every day   Poor appetite, weight loss, or overeating More than half the days   Feeling bad about yourself - or that you are a failure or have let yourself or your family down More than half the days   Trouble concentrating on things such as school, work, reading, or watching TV More than half the days   Moving or speaking so slowly that other people could have noticed; or the opposite being so fidgety that others notice Several days   Thoughts of being better off dead, or hurting yourself in some way Not at all   PHQ 9 Score 16   How difficult have these problems made it for you to do your work, take care of your home and get along with others Very difficult       Alcohol Risk Factor Screening (MALE > 65):   Do you average more 1 drink per night or more than 7 drinks a week: No     In the past three months have you have had more than 4 drinks containing alcohol on one occasion: No      Functional Ability and Level of Safety:   Hearing: Hearing is good.    Activities of Daily Living:  The home contains: shower seat  Patient does total self care    Ambulation: with no difficulty    Fall Risk:  Fall Risk Assessment, last 12 mths 07/18/2018   Able to walk? Yes   Fall in past 12 months? No       Abuse Screen:  Patient is not abused    Cognitive Screening   Has your family/caregiver stated any concerns about your memory: no  Cognitive Screening: Normal - serial 3    Patient Care Team   Patient Care Team:  Iona Coach, MD as PCP - [facility] Empaneled Provider    Assessment/Plan   Education and counseling provided:  Are appropriate based on today's review and evaluation  End-of-Life planning (with patient's consent)-advised completion of AMD forms  shingrix recommended    Diagnoses and all orders for this visit:    1. Essential hypertension   Reasonable control  2. Pure hypercholesterolemia   Lipids managed at [facility] pt to fax recent labs  3. Prediabetes   Weight reduction needed-discussed  4. Prostate cancer screening   [facility]A at [facility] done per pt  5. Anxiety   controlledf on buspar   F/u psych MD at [facility]  6. Encounter for hepatitis C screening test for low risk patient   Advised pt to fax labs and immunizations to our office  7. Skin lesion of right arm  -     REFERRAL TO DERMATOLOGY-c/w seb K but enlarging per pt        Health Maintenance Due   Topic Date Due   ??? Hepatitis C Screening  [birth year]   ??? A1C test (Diabetic or Prediabetic)  04/26/1959   ??? Shingrix Vaccine Age 85> (1 of 2)  04/26/1999   ??? GLAUCOMA SCREENING Q2Y  04/25/2014   ??? Lipid Screen  05/29/2015   ??? Pneumococcal 65+ years (2 of 2 - PPSV23) 05/29/2015   ??? DTaP/Tdap/Td series (2 - Td) 12/14/2016   ??? Influenza Age 41 to Adult  12/02/2017   ??? Medicare Yearly Exam  07/16/2018

## 2018-07-18 NOTE — Patient Instructions (Addendum)
Office Policies    Phone calls/patient messages:            Please allow up to 24 hours for someone in the office to contact you about your call or message. Be mindful your provider may be out of the office or your message may require further review. We encourage you to use MyChart for your messages as this is a faster, more efficient way to communicate with our office                         Medication Refills:            Prescription medications require 48-72 business hours to process. We encourage you to use MyChart for your refills.         For controlled medications: Please allow 72 business hours to process. Certain medications may require you to pick up a written prescription at our office.            NO narcotic/controlled medications will be prescribed after 4pm Monday through Friday or on weekends              Form/Paperwork Completion:            Please note a $25 fee may incur for all paperwork for completed by our providers. We ask that you allow 7-10 business days. Pre-payment is due prior to picking up/faxing the completed form. You may also download your forms to MyChart to have your doctor print off.    Medicare Wellness Visit, Male    The best way to live healthy is to have a lifestyle where you eat a well-balanced diet, exercise regularly, limit alcohol use, and quit all forms of tobacco/nicotine, if applicable.     Regular preventive services are another way to keep healthy. Preventive services (vaccines, screening tests, monitoring & exams) can help personalize your care plan, which helps you manage your own care. Screening tests can find health problems at the earliest stages, when they are easiest to treat.   [facility] follows the current, evidence-based guidelines published by the [place] [employer] (USPSTF) when recommending preventive services for our patients. Because we follow these guidelines, sometimes recommendations change over time as  research supports it. (For example, a prostate screening blood test is no longer routinely recommended for men with no symptoms).  Of course, you and your doctor may decide to screen more often for some diseases, based on your risk and co-morbidities (chronic disease you are already diagnosed with).     Preventive services for you include:  - Medicare offers their members a free annual wellness visit, which is time for you and your primary care provider to discuss and plan for your preventive service needs. Take advantage of this benefit every year!  -All adults over age 57 should receive the recommended pneumonia vaccines. Current USPSTF guidelines recommend a series of two vaccines for the best pneumonia protection.   -All adults should have a flu vaccine yearly and tetanus vaccine every 10 years.  -All adults age 62 and older should receive the shingles vaccines (series of two vaccines).       -All adults age 40-70 who are overweight should have a diabetes screening test once every three years.   -Other screening tests & preventive services for persons with diabetes include: an eye exam to screen for diabetic retinopathy, a kidney function test, a foot exam, and stricter control over  your cholesterol.   -Cardiovascular screening for adults with routine risk involves an electrocardiogram (ECG) at intervals determined by the provider.   -Colorectal cancer screening should be done for adults age 58-75 with no increased risk factors for colorectal cancer.  There are a number of acceptable methods of screening for this type of cancer. Each test has its own benefits and drawbacks. Discuss with your provider what is most appropriate for you during your annual wellness visit. The different tests include: colonoscopy (considered the best screening method), a fecal occult blood test, a fecal DNA test, and sigmoidoscopy.  -All adults born between 1945 and 1965 should be screened once for Hepatitis C.   -An Abdominal Aortic Aneurysm (AAA) Screening is recommended for men age 63-75 who has ever smoked in their lifetime.     Here is a list of your current Health Maintenance items (your personalized list of preventive services) with a due date:  Health Maintenance Due   Topic Date Due   ??? Hepatitis C Test  [birth year]   ??? Hemoglobin A1C    04/26/1959   ??? Shingles Vaccine (1 of 2) 04/26/1999   ??? Glaucoma Screening   04/25/2014   ??? Cholesterol Test   05/29/2015   ??? Pneumococcal Vaccine (2 of 2 - PPSV23) 05/29/2015   ??? DTaP/Tdap/Td  (2 - Td) 12/14/2016   ??? Flu Vaccine  12/02/2017   ??? Annual Well Visit  07/16/2018

## 2018-07-18 NOTE — Progress Notes (Signed)
Progress  Notes by Iona Coach, MD at 07/18/18 1030                Author: Iona Coach, MD  Service: --  Author Type: Physician       Filed: 07/18/18 1320  Encounter Date: 07/18/2018  Status: Signed          Editor: Iona Coach, MD (Physician)               HISTORY OF PRESENT ILLNESS   Austin Walker is a 68 y.o.  male.   HPI       ??   F/u for HTN HLD ??Prediabetes  OSA, chronic neck pain and medicare wellness-------------------   Sees MD at [facility] primary care clinic and psych D for anxiety and MDD   Sees primary Md q 6 mos at [facility]   Had septal deviation sx and had GB removal -lap choly last month  Due for pneumovax 23 and flu shot--done at [facility] per pt   Had colonoscopy last year-tics, no polyps--repeat in 10 yrs-Dr Trellis Paganini pysch MD at [facility] for anxiety and MDD   Still takes URSO ?? Pt has called Shannon ENT MD at [facility]   Has sore black raised lesion on right upper arm x 2 years      Last OVC   Pt is seen at [facility] clinic for primary care and mental health clinic for anxiety and depression   Had egd /colonsocopy since last OV--duodenal erosion, tics, hemorrhoids--repeat 10 yrs   Had lower abdominal pain -but colonoscopy was ok. Pain is better   Sees psych MD again soon --buspar helps   Has cyst on right side--skin tag    Has chronic low back but but usually no sciatica   ??   Gets labs at least once per year at [facility] per pt-has prediabetes        Patient Active Problem List           Diagnosis  Date Noted         ?  Hypertension with goal blood pressure less than 140/90  10/09/2014     ?  Pure hypercholesterolemia  10/09/2014     ?  Anxiety  10/09/2014     ?  Mild single current episode of major depressive disorder ([place])  10/09/2014     ?  Primary insomnia  10/09/2014     ?  OSA on CPAP  10/09/2014     ?  DJD (degenerative joint disease)  10/09/2014     ?  Sciatica  10/09/2014     ?  Prediabetes  10/09/2014     ?  Gastroesophageal reflux disease without esophagitis  10/09/2014     ?  TIA (transient  ischemic attack)  10/09/2014         ?  Morbid obesity due to excess calories ([place])  10/09/2014          Current Outpatient Medications          Medication  Sig  Dispense  Refill           ?  lidocaine (XYLOCAINE) 5 % ointment  Apply  to affected area as needed for Pain.         ?  ursodiol (ACTIGALL) 300 mg capsule  Take 300 mg by mouth two (2) times a day.         ?  meclizine (ANTIVERT) 25 mg chewable  tablet  Take  by mouth three (3) times daily as needed.         ?  lisinopril (PRINIVIL, ZESTRIL) 20 mg tablet  Take 40 mg by mouth daily.         ?  cpap machine kit  by Does Not Apply route.         ?  aspirin delayed-release 81 mg tablet  Take  by mouth daily.         ?  atorvastatin (LIPITOR) 80 mg tablet  Take 80 mg by mouth daily. 1/2 tab         ?  capsicum oleoresin 0.025 % topical cream  Apply  to affected area three (3) times daily.         ?  carboxymethylcellulose sodium 1 % dlgl  Apply  to eye.         ?  fluticasone (FLONASE) 50 mcg/actuation nasal spray  2 Sprays by Both Nostrils route once.         ?  ketotifen (ZADITOR) 0.025 % (0.035 %) ophthalmic solution  Administer 1 Drop to both eyes two (2) times a day.         ?  loratadine (CLARITIN) 10 mg tablet  Take 10 mg by mouth.         ?  NIFEdipine ER (PROCARDIA XL) 60 mg ER tablet  Take 30 mg by mouth daily.         ?  ketoconazole (NIZORAL) 2 % topical cream  Apply  to affected area daily.         ?  Omeprazole delayed release (PRILOSEC D/R) 20 mg tablet  Take 20 mg by mouth daily.         ?  cholecalciferol (VITAMIN D3) 1,000 unit tablet  Take  by mouth daily.         ?  methocarbamol (ROBAXIN) 500 mg tablet  Take  by mouth four (4) times daily.         ?  prazosin (MINIPRESS) 2 mg capsule  Take 2 mg by mouth nightly.         ?  busPIRone (BUSPAR) 10 mg tablet  Take 10 mg by mouth two (2) times a day.         ?  candesartan-hydroCHLOROthiazide (ATACAND HCT) 32-25 mg tab per tablet  Take 1 Tab by mouth daily.  90 Tab  3     ?  naproxen (NAPROSYN)  375 mg tablet  Take 375 mg by mouth two (2) times daily (with meals).               ?  sertraline (ZOLOFT) 100 mg tablet  Take 150 mg by mouth.            No Known Allergies     Social History          Tobacco Use         ?  Smoking status:  Never Smoker     ?  Smokeless tobacco:  Never Used       Substance Use Topics         ?  Alcohol use:  No                Review of Systems    Constitutional: Negative for chills, fever, malaise/fatigue and weight loss.    Eyes: Negative for blurred vision and double vision.    Respiratory: Negative for cough and shortness of breath.  Cardiovascular: Negative for chest pain and palpitations.    Gastrointestinal: Negative for abdominal pain, blood in stool, constipation, diarrhea, melena, nausea and vomiting.    Genitourinary: Negative for dysuria, frequency, hematuria and urgency.    Musculoskeletal: Negative for back pain, falls, joint pain and myalgias.    Neurological: Negative for dizziness, tremors and headaches.          Physical Exam   Vitals signs and nursing note reviewed.   Constitutional:        General: He is not in acute distress.     Appearance: He is well-developed.      Comments: Appears stated age   HENT:       Head: Normocephalic.   Cardiovascular :       Rate and Rhythm: Normal rate and regular rhythm.      Heart sounds: Normal heart sounds.    Pulmonary:       Effort: Pulmonary effort is normal.      Breath sounds: Normal breath sounds.   Abdominal :      Palpations: Abdomen is soft.    Skin:            Neurological :       Mental Status: He is alert.            ASSESSMENT and PLAN   Diagnoses and all orders for this visit:      1. Essential hypertension      2. Pure hypercholesterolemia      3. Prediabetes      4. Prostate cancer screening      5. Anxiety      6. Encounter for hepatitis C screening test for low risk patient      7. Skin lesion of right arm   -     REFERRAL TO DERMATOLOGY      8. Medicare annual wellness visit, subsequent            Follow-up and Dispositions      ??  Return in about 1 year (around 07/18/2019) for cpe.              This is the Subsequent Medicare Annual Wellness Exam, performed 12 months or more after the Initial AWV or the last Subsequent AWV      I have reviewed the patient's medical history in detail and updated the computerized patient record.         History          Patient Active Problem List        Diagnosis  Code         ?  Hypertension with goal blood pressure less than 140/90  I10     ?  Pure hypercholesterolemia  E78.00     ?  Anxiety  F41.9     ?  Mild single current episode of major depressive disorder (HCC)  F32.0     ?  Primary insomnia  F51.01     ?  OSA on CPAP  G47.33, Z99.89     ?  DJD (degenerative joint disease)  M19.90     ?  Sciatica  M54.30     ?  Prediabetes  R73.03     ?  Gastroesophageal reflux disease without esophagitis  K21.9     ?  TIA (transient ischemic attack)  G45.9         ?  Morbid obesity due to excess calories (HCC)  E66.01  No past medical history on file.      Past Surgical History:         Procedure  Laterality  Date          ?  HX TONSILLECTOMY         ?  HX UROLOGICAL              sx at [facility] due to inabilitly to void at 5-6          Current Outpatient Medications          Medication  Sig  Dispense  Refill           ?  lidocaine (XYLOCAINE) 5 % ointment  Apply  to affected area as needed for Pain.         ?  ursodiol (ACTIGALL) 300 mg capsule  Take 300 mg by mouth two (2) times a day.         ?  meclizine (ANTIVERT) 25 mg chewable tablet  Take  by mouth three (3) times daily as needed.         ?  lisinopril (PRINIVIL, ZESTRIL) 20 mg tablet  Take 40 mg by mouth daily.         ?  cpap machine kit  by Does Not Apply route.         ?  aspirin delayed-release 81 mg tablet  Take  by mouth daily.         ?  atorvastatin (LIPITOR) 80 mg tablet  Take 80 mg by mouth daily. 1/2 tab         ?  capsicum oleoresin 0.025 % topical cream  Apply  to affected area three (3) times daily.          ?  carboxymethylcellulose sodium 1 % dlgl  Apply  to eye.         ?  fluticasone (FLONASE) 50 mcg/actuation nasal spray  2 Sprays by Both Nostrils route once.         ?  ketotifen (ZADITOR) 0.025 % (0.035 %) ophthalmic solution  Administer 1 Drop to both eyes two (2) times a day.         ?  loratadine (CLARITIN) 10 mg tablet  Take 10 mg by mouth.         ?  NIFEdipine ER (PROCARDIA XL) 60 mg ER tablet  Take 30 mg by mouth daily.               ?  ketoconazole (NIZORAL) 2 % topical cream  Apply  to affected area daily.               ?  Omeprazole delayed release (PRILOSEC D/R) 20 mg tablet  Take 20 mg by mouth daily.         ?  cholecalciferol (VITAMIN D3) 1,000 unit tablet  Take  by mouth daily.         ?  methocarbamol (ROBAXIN) 500 mg tablet  Take  by mouth four (4) times daily.         ?  prazosin (MINIPRESS) 2 mg capsule  Take 2 mg by mouth nightly.         ?  busPIRone (BUSPAR) 10 mg tablet  Take 10 mg by mouth two (2) times a day.         ?  candesartan-hydroCHLOROthiazide (ATACAND HCT) 32-25 mg tab per tablet  Take 1 Tab by mouth daily.  90 Tab  3     ?  naproxen (NAPROSYN) 375 mg tablet  Take 375 mg by mouth two (2) times daily (with meals).               ?  sertraline (ZOLOFT) 100 mg tablet  Take 150 mg by mouth.            No Known Allergies        Family History         Problem  Relation  Age of Onset          ?  Lung Disease  Mother            Social History          Tobacco Use         ?  Smoking status:  Never Smoker     ?  Smokeless tobacco:  Never Used       Substance Use Topics         ?  Alcohol use:  No             Depression Risk Factor Screening:           3 most recent PHQ Screens  10/09/2014        Little interest or pleasure in doing things  More than half the days     Feeling down, depressed, irritable, or hopeless  More than half the days     Total Score PHQ 2  4     Trouble falling or staying asleep, or sleeping too much  More than half the days     Feeling tired or having little energy   Nearly every day     Poor appetite, weight loss, or overeating  More than half the days     Feeling bad about yourself - or that you are a failure or have let yourself or your family down  More than half the days     Trouble concentrating on things such as school, work, reading, or watching TV  More than half the days     Moving or speaking so slowly that other people could have noticed; or the opposite being so fidgety that others notice  Several days     Thoughts of being better off dead, or hurting yourself in some way  Not at all     PHQ 9 Score  16        How difficult have these problems made it for you to do your work, take care of your home and get along with others  Very difficult             Alcohol Risk Factor Screening (MALE > 65) :     Do you average more 1 drink per night or more than 7 drinks a week: No      In the past three months have you have had more than 4 drinks containing alcohol on one occasion: No           Functional Ability and Level of Safety:     Hearing: Hearing  is good.      Activities of Daily Living:   The home contains: shower seat   Patient does total self care      Ambulation: with no difficulty      Fall Risk:      Fall Risk Assessment, last 12 mths  07/18/2018        Able to walk?  Yes  Fall in past 12 months?  No           Abuse Screen:   Patient is not abused        Cognitive Screening     Has your family/caregiver stated any concerns about your memory: no   Cognitive Screening: Normal - serial 3        Patient Care Team     Patient Care Team:   Iona Coach, MD as PCP - [facility] Empaneled Provider        Assessment/Plan     Education and counseling provided:   Are appropriate based on today's review and evaluation   End-of-Life planning (with patient's consent)-advised completion of AMD forms   shingrix recommended      Diagnoses and all orders for this visit:      1. Essential hypertension    Reasonable control   2. Pure hypercholesterolemia    Lipids managed at  [facility] pt to fax recent labs   3. Prediabetes    Weight reduction needed-discussed   4. Prostate cancer screening    PSA at [facility] done per pt   5. Anxiety    controlledf on buspar    F/u psych MD at [facility]   6. Encounter for hepatitis C screening test for low risk patient    Advised pt to fax labs and immunizations to our office   7. Skin lesion of right arm   -     REFERRAL TO DERMATOLOGY-c/w seb K but enlarging per pt              Health Maintenance Due        Topic  Date Due         ?  Hepatitis C Screening   02-14-1951     ?  A1C test (Diabetic or Prediabetic)   04/26/1959     ?  Shingrix Vaccine Age 57> (1 of 2)  04/26/1999     ?  GLAUCOMA SCREENING Q2Y   04/25/2014     ?  Lipid Screen   05/29/2015     ?  Pneumococcal 65+ years (2 of 2 - PPSV23)  05/29/2015     ?  DTaP/Tdap/Td series (2 - Td)  12/14/2016     ?  Influenza Age 39 to Adult   12/02/2017         ?  Medicare Yearly Exam   07/16/2018

## 2018-08-16 DIAGNOSIS — H401132 Primary open-angle glaucoma, bilateral, moderate stage: Secondary | ICD-10-CM | POA: Diagnosis not present

## 2018-12-13 DIAGNOSIS — H401132 Primary open-angle glaucoma, bilateral, moderate stage: Secondary | ICD-10-CM | POA: Diagnosis not present

## 2019-01-11 DIAGNOSIS — Z23 Encounter for immunization: Secondary | ICD-10-CM | POA: Diagnosis not present

## 2019-02-25 DIAGNOSIS — Z23 Encounter for immunization: Secondary | ICD-10-CM | POA: Diagnosis not present

## 2019-04-11 DIAGNOSIS — H401132 Primary open-angle glaucoma, bilateral, moderate stage: Secondary | ICD-10-CM | POA: Diagnosis not present

## 2019-06-29 ENCOUNTER — Other Ambulatory Visit: Payer: Self-pay

## 2019-06-29 ENCOUNTER — Ambulatory Visit (INDEPENDENT_AMBULATORY_CARE_PROVIDER_SITE_OTHER): Payer: Medicare Other | Admitting: Family Medicine

## 2019-06-29 VITALS — BP 130/88 | HR 60 | Temp 96.7°F | Ht 70.0 in | Wt 187.2 lb

## 2019-06-29 DIAGNOSIS — Z8639 Personal history of other endocrine, nutritional and metabolic disease: Secondary | ICD-10-CM

## 2019-06-29 DIAGNOSIS — E559 Vitamin D deficiency, unspecified: Secondary | ICD-10-CM

## 2019-06-29 DIAGNOSIS — I482 Chronic atrial fibrillation, unspecified: Secondary | ICD-10-CM | POA: Diagnosis not present

## 2019-06-29 DIAGNOSIS — E785 Hyperlipidemia, unspecified: Secondary | ICD-10-CM

## 2019-06-29 DIAGNOSIS — N529 Male erectile dysfunction, unspecified: Secondary | ICD-10-CM | POA: Diagnosis not present

## 2019-06-29 DIAGNOSIS — H409 Unspecified glaucoma: Secondary | ICD-10-CM

## 2019-06-29 MED ORDER — ATORVASTATIN CALCIUM 20 MG PO TABS: 20.0000 mg | ORAL_TABLET | Freq: Every day | ORAL | 4 refills | Status: DC

## 2019-06-29 MED ORDER — TADALAFIL 20 MG PO TABS: 20.0000 mg | ORAL_TABLET | Freq: Every day | ORAL | 5 refills | Status: DC | PRN

## 2019-06-29 NOTE — Patient Instructions (Addendum)
  Riley Beasley , Thank you for taking time to come for your Medicare Wellness Visit. I appreciate your ongoing commitment to your health goals. Please review the following plan we discussed and let me know if I can assist you in the future.   These are the goals we discussed: Start taking the Lipitor.  If he have any difficulties with the medication you are to call.  Otherwise I will see you in 2 months. This is a list of the screening recommended for you and due dates:  Health Maintenance  Topic Date Due  . Tetanus Vaccine  03/10/2021  . Colon Cancer Screening  06/09/2023  . Flu Shot  Completed  .  Hepatitis C: One time screening is recommended by Center for Disease Control  (CDC) for  adults born from [birth year] through 1967.   Completed  . Pneumonia vaccines  Completed

## 2019-06-29 NOTE — Progress Notes (Signed)
Riley Beasley is a 69 y.o. male who presents for annual wellness visit and follow-up on chronic medical conditions.  He has no particular concerns or complaints.  He does take over-the-counter medications including a multivitamin.  He does have glaucoma and sees ophthalmology regularly for that.  He has noted some difficulty recently with erectile dysfunction and would like medication for that.  He takes excellent care of himself.  His home life is going well.  He is retired.  He does have a previous history of atrial fibrillation.  Presently not on any anticoagulant.  He does have a previous history of vitamin D deficiency.   Immunizations and Health Maintenance Immunization History  Administered Date(s) Administered  . DTaP 05/03/2003  . Influenza Split 03/11/2011, 03/15/2012  . Influenza Whole 03/03/2010  . Influenza, High Dose Seasonal PF 03/25/2015, 03/24/2016, 02/25/2019  . Influenza,inj,Quad PF,6+ Mos 03/17/2013, 03/22/2014  . Pneumococcal Conjugate-13 03/25/2015  . Pneumococcal Polysaccharide-23 03/24/2016  . Tdap 03/11/2011  . Zoster 03/03/2010  . Zoster Recombinat (Shingrix) 02/25/2019   There are no preventive care reminders to display for this patient.  Last colonoscopy:2015 Last PSA:N/A Dentist:[facility] Ophtho:[place] Exercise:daily  Other doctors caring for patient include:none   Advanced Directives: Does Patient Have a Medical Advance Directive?: Yes Type of Advance Directive: Healthcare Power of Attorney, Living will, Out of facility DNR (pink MOST or yellow form) Does patient want to make changes to medical advance directive?: No - Patient declined Copy of [place] in Chart?: No - copy requested  Depression screen:  See questionnaire below.     Depression screen [facility] 2/9 06/29/2019 06/23/2018 06/21/2017 03/24/2016 03/25/2015  Decreased Interest 0 0 0 0 0  Down, Depressed, Hopeless 0 0 0 0 0  PHQ - 2 Score 0 0 0 0 0    Fall Screen: See  Questionaire below.   Fall Risk  06/29/2019 06/23/2018 06/21/2017 03/24/2016 03/25/2015  Falls in the past year? 0 1 No No No    ADL screen:  See questionnaire below.  Functional Status Survey: Is the patient deaf or have difficulty hearing?: No Does the patient have difficulty seeing, even when wearing glasses/contacts?: No(has glaucoma and sees eye doctor 2-3 times a year) Does the patient have difficulty concentrating, remembering, or making decisions?: No Does the patient have difficulty walking or climbing stairs?: No Does the patient have difficulty dressing or bathing?: No Does the patient have difficulty doing errands alone such as visiting a doctor's office or shopping?: No   Review of Systems  Constitutional: -, -unexpected weight change, -anorexia, -fatigue Allergy: -sneezing, -itching, -congestion Dermatology: denies changing moles, rash, lumps ENT: -runny nose, -ear pain, -sore throat,  Cardiology:  -chest pain, -palpitations, -orthopnea, Respiratory: -cough, -shortness of breath, -dyspnea on exertion, -wheezing,  Gastroenterology: -abdominal pain, -nausea, -vomiting, -diarrhea, -constipation, -dysphagia Hematology: -bleeding or bruising problems Musculoskeletal: -arthralgias, -myalgias, -joint swelling, -back pain, - Ophthalmology: -vision changes,  Urology: -dysuria, -difficulty urinating,  -urinary frequency, -urgency, incontinence Neurology: -, -numbness, , -memory loss, -falls, -dizziness    PHYSICAL EXAM:  BP 130/88   Pulse 60   Temp (!) 96.7 F (35.9 C) (Other (Comment))   Ht 5\' 10"  (1.778 m)   Wt 187 lb 3.2 oz (84.9 kg)   BMI 26.86 kg/m   General Appearance: Alert, cooperative, no distress, appears stated age Head: Normocephalic, without obvious abnormality, atraumatic Eyes: PERRL, conjunctiva/corneas clear, EOM's intact, fbenign Ears: Normal TM's and external ear canals Nose: Nares normal, mucosa normal, no drainage or sinus  tenderness Throat:  Lips, mucosa, and tongue normal; teeth and gums normal Neck: Supple, no lymphadenopathy, thyroid:no enlargement/tenderness/nodules; no carotid bruit or JVD Lungs: Clear to auscultation bilaterally without wheezes, rales or ronchi; respirations unlabored Heart: Regular rate and rhythm, S1 and S2 normal, no murmur, rub or gallop Abdomen: Soft, non-tender, nondistended, normoactive bowel sounds, no masses, no hepatosplenomegaly Skin: Skin color, texture, turgor normal, no rashes or lesions Lymph nodes: Cervical, supraclavicular, and axillary nodes normal Neurologic: CNII-XII intact, normal strength, sensation and gait; reflexes 2+ and symmetric throughout   Psych: Normal mood, affect, hygiene and grooming  ASSESSMENT/PLAN: Chronic atrial fibrillation (HCC)  Glaucoma, unspecified glaucoma type, unspecified laterality  History of vitamin D deficiency - Plan: VITAMIN D 25 Hydroxy (Vit-D Deficiency, Fractures)  Hyperlipidemia with target low density lipoprotein (LDL) cholesterol less than 100 mg/dL - Plan: Lipid panel, atorvastatin (LIPITOR) 20 MG tablet  Erectile dysfunction, unspecified erectile dysfunction type - Plan: tadalafil (CIALIS) 20 MG tablet  Vitamin D deficiency, unspecified  - Plan: VITAMIN D 25 Hydroxy (Vit-D Deficiency, Fractures) Discussed the fact that his lipids are elevated and he is above age 89, will place him on Lipitor.  Discussed possible side effects of the medication.  I then discussed his erectile dysfunction and proper use of the medication.  Prescription was written as he will send this to [place]  Discussed PSA screening (risks/benefits), Immunization recommendations discussed.  Colonoscopy recommendations reviewed.   Medicare Attestation I have personally reviewed: The patient's medical and social history Their use of alcohol, tobacco or illicit drugs Their current medications and supplements The patient's functional ability including ADLs,fall risks, home  safety risks, cognitive, and hearing and visual impairment Diet and physical activities Evidence for depression or mood disorders  The patient's weight, height, and BMI have been recorded in the chart.  I have made referrals, counseling, and provided education to the patient based on review of the above and I have provided the patient with a written personalized care plan for preventive services.     Sharlot Gowda, MD   06/29/2019

## 2019-06-30 LAB — VITAMIN D 25 HYDROXY (VIT D DEFICIENCY, FRACTURES): Vit D, 25-Hydroxy: 44.6 ng/mL (ref 30.0–100.0)

## 2019-06-30 LAB — LIPID PANEL
Chol/HDL Ratio: 3.9 ratio (ref 0.0–5.0)
Cholesterol, Total: 215 mg/dL — ABNORMAL HIGH (ref 100–199)
HDL: 55 mg/dL (ref >39)
LDL Chol Calc (NIH): 140 mg/dL — ABNORMAL HIGH (ref 0–99)
Triglycerides: 112 mg/dL (ref 0–149)
VLDL Cholesterol Cal: 20 mg/dL (ref 5–40)

## 2019-07-24 ENCOUNTER — Encounter: Attending: Internal Medicine

## 2019-08-01 ENCOUNTER — Telehealth: Payer: Self-pay | Admitting: Internal Medicine

## 2019-08-01 DIAGNOSIS — Z8042 Family history of malignant neoplasm of prostate: Secondary | ICD-10-CM

## 2019-08-01 DIAGNOSIS — Z8639 Personal history of other endocrine, nutritional and metabolic disease: Secondary | ICD-10-CM

## 2019-08-01 DIAGNOSIS — I482 Chronic atrial fibrillation, unspecified: Secondary | ICD-10-CM

## 2019-08-01 DIAGNOSIS — E785 Hyperlipidemia, unspecified: Secondary | ICD-10-CM

## 2019-08-01 NOTE — Telephone Encounter (Signed)
Pt has upcoming appt soon for fasting labs but no future orders have been placed

## 2019-08-02 NOTE — Addendum Note (Signed)
Addended by: Ronnald Nian on: 08/02/2019 03:21 PM   Modules accepted: Orders

## 2019-08-08 DIAGNOSIS — H401132 Primary open-angle glaucoma, bilateral, moderate stage: Secondary | ICD-10-CM | POA: Diagnosis not present

## 2019-08-24 ENCOUNTER — Other Ambulatory Visit: Payer: Self-pay

## 2019-08-24 ENCOUNTER — Other Ambulatory Visit: Payer: Medicare Other

## 2019-08-24 DIAGNOSIS — I482 Chronic atrial fibrillation, unspecified: Secondary | ICD-10-CM | POA: Diagnosis not present

## 2019-08-24 DIAGNOSIS — E785 Hyperlipidemia, unspecified: Secondary | ICD-10-CM | POA: Diagnosis not present

## 2019-08-25 LAB — COMPREHENSIVE METABOLIC PANEL
ALT: 25 IU/L (ref 0–44)
AST: 33 IU/L (ref 0–40)
Albumin/Globulin Ratio: 2.1 (ref 1.2–2.2)
Albumin: 4.4 g/dL (ref 3.8–4.8)
Alkaline Phosphatase: 67 IU/L (ref 39–117)
BUN/Creatinine Ratio: 15 (ref 10–24)
BUN: 18 mg/dL (ref 8–27)
Bilirubin Total: 0.7 mg/dL (ref 0.0–1.2)
CO2: 25 mmol/L (ref 20–29)
Calcium: 9.1 mg/dL (ref 8.6–10.2)
Chloride: 105 mmol/L (ref 96–106)
Creatinine, Ser: 1.18 mg/dL (ref 0.76–1.27)
GFR calc Af Amer: 72 mL/min/{1.73_m2} (ref >59)
GFR calc non Af Amer: 62 mL/min/{1.73_m2} (ref >59)
Globulin, Total: 2.1 g/dL (ref 1.5–4.5)
Glucose: 93 mg/dL (ref 65–99)
Potassium: 5.6 mmol/L — ABNORMAL HIGH (ref 3.5–5.2)
Sodium: 141 mmol/L (ref 134–144)
Total Protein: 6.5 g/dL (ref 6.0–8.5)

## 2019-08-25 LAB — CBC WITH DIFFERENTIAL/PLATELET
Basophils Absolute: 0 10*3/uL (ref 0.0–0.2)
Basos: 1 %
EOS (ABSOLUTE): 0.2 10*3/uL (ref 0.0–0.4)
Eos: 4 %
Hematocrit: 44.7 % (ref 37.5–51.0)
Hemoglobin: 14.7 g/dL (ref 13.0–17.7)
Immature Grans (Abs): 0 10*3/uL (ref 0.0–0.1)
Immature Granulocytes: 0 %
Lymphocytes Absolute: 2 10*3/uL (ref 0.7–3.1)
Lymphs: 36 %
MCH: 27.4 pg (ref 26.6–33.0)
MCHC: 32.9 g/dL (ref 31.5–35.7)
MCV: 83 fL (ref 79–97)
Monocytes Absolute: 0.4 10*3/uL (ref 0.1–0.9)
Monocytes: 8 %
Neutrophils Absolute: 2.8 10*3/uL (ref 1.4–7.0)
Neutrophils: 51 %
Platelets: 168 10*3/uL (ref 150–450)
RBC: 5.36 x10E6/uL (ref 4.14–5.80)
RDW: 12.5 % (ref 11.6–15.4)
WBC: 5.5 10*3/uL (ref 3.4–10.8)

## 2019-08-25 LAB — LIPID PANEL
Chol/HDL Ratio: 2.4 ratio (ref 0.0–5.0)
Cholesterol, Total: 142 mg/dL (ref 100–199)
HDL: 58 mg/dL (ref >39)
LDL Chol Calc (NIH): 72 mg/dL (ref 0–99)
Triglycerides: 56 mg/dL (ref 0–149)
VLDL Cholesterol Cal: 12 mg/dL (ref 5–40)

## 2019-12-12 DIAGNOSIS — H401132 Primary open-angle glaucoma, bilateral, moderate stage: Secondary | ICD-10-CM | POA: Diagnosis not present

## 2020-01-24 DIAGNOSIS — Z23 Encounter for immunization: Secondary | ICD-10-CM | POA: Diagnosis not present

## 2020-02-28 DIAGNOSIS — Z23 Encounter for immunization: Secondary | ICD-10-CM | POA: Diagnosis not present

## 2020-04-09 DIAGNOSIS — H401132 Primary open-angle glaucoma, bilateral, moderate stage: Secondary | ICD-10-CM | POA: Diagnosis not present

## 2020-08-04 DIAGNOSIS — Z23 Encounter for immunization: Secondary | ICD-10-CM | POA: Diagnosis not present

## 2020-08-06 DIAGNOSIS — H401132 Primary open-angle glaucoma, bilateral, moderate stage: Secondary | ICD-10-CM | POA: Diagnosis not present

## 2020-08-08 ENCOUNTER — Other Ambulatory Visit: Payer: Self-pay

## 2020-08-08 ENCOUNTER — Encounter: Payer: Self-pay | Admitting: Family Medicine

## 2020-08-08 ENCOUNTER — Ambulatory Visit (INDEPENDENT_AMBULATORY_CARE_PROVIDER_SITE_OTHER): Payer: Medicare Other | Admitting: Family Medicine

## 2020-08-08 VITALS — BP 130/88 | HR 79 | Temp 96.7°F | Ht 69.0 in | Wt 179.0 lb

## 2020-08-08 DIAGNOSIS — E785 Hyperlipidemia, unspecified: Secondary | ICD-10-CM

## 2020-08-08 DIAGNOSIS — Z8639 Personal history of other endocrine, nutritional and metabolic disease: Secondary | ICD-10-CM | POA: Diagnosis not present

## 2020-08-08 DIAGNOSIS — I482 Chronic atrial fibrillation, unspecified: Secondary | ICD-10-CM | POA: Diagnosis not present

## 2020-08-08 DIAGNOSIS — N529 Male erectile dysfunction, unspecified: Secondary | ICD-10-CM

## 2020-08-08 DIAGNOSIS — Z8042 Family history of malignant neoplasm of prostate: Secondary | ICD-10-CM | POA: Diagnosis not present

## 2020-08-08 DIAGNOSIS — H409 Unspecified glaucoma: Secondary | ICD-10-CM

## 2020-08-08 MED ORDER — ATORVASTATIN CALCIUM 20 MG PO TABS: 20.0000 mg | ORAL_TABLET | Freq: Every day | ORAL | 4 refills | Status: DC

## 2020-08-08 MED ORDER — TADALAFIL 20 MG PO TABS: 20.0000 mg | ORAL_TABLET | Freq: Every day | ORAL | 5 refills | Status: DC | PRN

## 2020-08-08 NOTE — Progress Notes (Signed)
Riley Beasley is a 70 y.o. male who presents for annual wellness visit and follow-up on chronic medical conditions.  He has no particular concerns or complaints.  He did have his last Shingrix shot at the [facility].  He would like a refill on his Cialis.  He does have glaucoma and is followed by his ophthalmologist.  He does have a history of vitamin D deficiency and is taking a good multivitamin back to vitamin D.  He also has a history of chronic atrial fibrillation.  His chads score is 1.  He has a family history of prostate cancer however review of his previous PSAs have all been in the good range.  His home life is going well.  He is now retired and enjoying this.   Immunizations and Health Maintenance Immunization History  Administered Date(s) Administered  . DTaP 05/03/2003  . Influenza Split 03/11/2011, 03/15/2012  . Influenza Whole 03/03/2010  . Influenza, High Dose Seasonal PF 03/25/2015, 03/24/2016, 02/25/2019  . Influenza,inj,Quad PF,6+ Mos 03/17/2013, 03/22/2014  . Pneumococcal Conjugate-13 03/25/2015  . Pneumococcal Polysaccharide-23 03/24/2016  . Tdap 03/11/2011  . Zoster 03/03/2010  . Zoster Recombinat (Shingrix) 02/25/2019   Health Maintenance Due  Topic Date Due  . COVID-19 Vaccine (1) Never done    Last colonoscopy: 06/08/13 Last PSA:06/23/18 Dentist: Q six months  Ophtho: Q  Four months Exercise: Work class three days a week for one hour  Other doctors caring for patient include: Dr. Leone Payor GI  Advanced Directives: Does Patient Have a Medical Advance Directive?: Yes Type of Advance Directive: Living will Does patient want to make changes to medical advance directive?: No - Patient declined Asked to bring a copy Depression screen:  See questionnaire below.     Depression screen [facility] 2/9 08/08/2020 06/29/2019 06/23/2018 06/21/2017 03/24/2016  Decreased Interest 0 0 0 0 0  Down, Depressed, Hopeless 0 0 0 0 0  PHQ - 2 Score 0 0 0 0 0    Fall Screen: See  Questionaire below.   Fall Risk  08/08/2020 06/29/2019 06/23/2018 06/21/2017 03/24/2016  Falls in the past year? 0 0 1 No No  Number falls in past yr: 0 - - - -  Injury with Fall? 0 - - - -  Risk for fall due to : No Fall Risks - - - -  Follow up Falls evaluation completed - - - -    ADL screen:  See questionnaire below.  Functional Status Survey: Is the patient deaf or have difficulty hearing?: No Does the patient have difficulty seeing, even when wearing glasses/contacts?: No Does the patient have difficulty concentrating, remembering, or making decisions?: No Does the patient have difficulty walking or climbing stairs?: No Does the patient have difficulty dressing or bathing?: No Does the patient have difficulty doing errands alone such as visiting a doctor's office or shopping?: No   Review of Systems  Constitutional: -, -unexpected weight change, -anorexia, -fatigue Allergy: -sneezing, -itching, -congestion Dermatology: denies changing moles, rash, lumps ENT: -runny nose, -ear pain, -sore throat,  Cardiology:  -chest pain, -palpitations, -orthopnea, Respiratory: -cough, -shortness of breath, -dyspnea on exertion, -wheezing,  Gastroenterology: -abdominal pain, -nausea, -vomiting, -diarrhea, -constipation, -dysphagia Hematology: -bleeding or bruising problems Musculoskeletal: -arthralgias, -myalgias, -joint swelling, -back pain, - Ophthalmology: -vision changes,  Urology: -dysuria, -difficulty urinating,  -urinary frequency, -urgency, incontinence Neurology: -, -numbness, , -memory loss, -falls, -dizziness    PHYSICAL EXAM:   General Appearance: Alert, cooperative, no distress, appears stated age Head: Normocephalic, without obvious abnormality,  atraumatic Eyes: PERRL, conjunctiva/corneas clear, EOM's intact,  Ears: Normal TM's and external ear canals Nose: Nares normal, mucosa normal, no drainage or sinus   tenderness Throat: Lips, mucosa, and tongue normal; teeth and gums  normal Neck: Supple, no lymphadenopathy, thyroid:no enlargement/tenderness/nodules; no carotid bruit or JVD Lungs: Clear to auscultation bilaterally without wheezes, rales or ronchi; respirations unlabored Heart: Irregular rate and rhythm, S1 and S2 normal, no murmur, rub or gallop Abdomen: Soft, non-tender, nondistended, normoactive bowel sounds, no masses, no hepatosplenomegaly Skin: Skin color, texture, turgor normal, no rashes or lesions Lymph nodes: Cervical, supraclavicular, and axillary nodes normal Neurologic: CNII-XII intact, normal strength, sensation and gait; reflexes 2+ and symmetric throughout   Psych: Normal mood, affect, hygiene and grooming  ASSESSMENT/PLAN: Glaucoma, unspecified glaucoma type, unspecified laterality  Erectile dysfunction, unspecified erectile dysfunction type - Plan: tadalafil (CIALIS) 20 MG tablet, CBC with Differential/Platelet, Comprehensive metabolic panel  Hyperlipidemia with target low density lipoprotein (LDL) cholesterol less than 100 mg/dL - Plan: atorvastatin (LIPITOR) 20 MG tablet, Lipid panel  Chronic atrial fibrillation (HCC)  History of vitamin D deficiency  Family history of prostate cancer He will continue be followed by ophthalmology.  Cialis was called in for him.  Continue on Lipitor.  Discussed atrial for with him and at this point no further intervention is needed.  Continue on multivitamins.    Discussed  at least 30 minutes of aerobic activity at least 5 days/week; healthy diet reviewed; Marland Kitchen Immunization recommendations discussed.  Colonoscopy recommendations reviewed.   Medicare Attestation I have personally reviewed: The patient's medical and social history Their use of alcohol, tobacco or illicit drugs Their current medications and supplements The patient's functional ability including ADLs,fall risks, home safety risks, cognitive, and hearing and visual impairment Diet and physical activities Evidence for depression or  mood disorders  The patient's weight, height, and BMI have been recorded in the chart.  I have made referrals, counseling, and provided education to the patient based on review of the above and I have provided the patient with a written personalized care plan for preventive services.     Sharlot Gowda, MD   08/08/2020

## 2020-08-09 LAB — CBC WITH DIFFERENTIAL/PLATELET
Basophils Absolute: 0.1 10*3/uL (ref 0.0–0.2)
Basos: 1 %
EOS (ABSOLUTE): 0.2 10*3/uL (ref 0.0–0.4)
Eos: 3 %
Hematocrit: 45 % (ref 37.5–51.0)
Hemoglobin: 15.1 g/dL (ref 13.0–17.7)
Immature Grans (Abs): 0 10*3/uL (ref 0.0–0.1)
Immature Granulocytes: 0 %
Lymphocytes Absolute: 2.7 10*3/uL (ref 0.7–3.1)
Lymphs: 38 %
MCH: 27.7 pg (ref 26.6–33.0)
MCHC: 33.6 g/dL (ref 31.5–35.7)
MCV: 83 fL (ref 79–97)
Monocytes Absolute: 0.5 10*3/uL (ref 0.1–0.9)
Monocytes: 8 %
Neutrophils Absolute: 3.7 10*3/uL (ref 1.4–7.0)
Neutrophils: 50 %
Platelets: 186 10*3/uL (ref 150–450)
RBC: 5.45 x10E6/uL (ref 4.14–5.80)
RDW: 12.7 % (ref 11.6–15.4)
WBC: 7.2 10*3/uL (ref 3.4–10.8)

## 2020-08-09 LAB — COMPREHENSIVE METABOLIC PANEL
ALT: 20 IU/L (ref 0–44)
AST: 28 IU/L (ref 0–40)
Albumin/Globulin Ratio: 1.8 (ref 1.2–2.2)
Albumin: 4.6 g/dL (ref 3.7–4.7)
Alkaline Phosphatase: 68 IU/L (ref 44–121)
BUN/Creatinine Ratio: 13 (ref 10–24)
BUN: 13 mg/dL (ref 8–27)
Bilirubin Total: 1.1 mg/dL (ref 0.0–1.2)
CO2: 24 mmol/L (ref 20–29)
Calcium: 9.6 mg/dL (ref 8.6–10.2)
Chloride: 103 mmol/L (ref 96–106)
Creatinine, Ser: 1.03 mg/dL (ref 0.76–1.27)
Globulin, Total: 2.6 g/dL (ref 1.5–4.5)
Glucose: 83 mg/dL (ref 65–99)
Potassium: 5 mmol/L (ref 3.5–5.2)
Sodium: 145 mmol/L — ABNORMAL HIGH (ref 134–144)
Total Protein: 7.2 g/dL (ref 6.0–8.5)
eGFR: 78 mL/min/{1.73_m2} (ref >59)

## 2020-08-09 LAB — LIPID PANEL
Chol/HDL Ratio: 2.8 ratio (ref 0.0–5.0)
Cholesterol, Total: 159 mg/dL (ref 100–199)
HDL: 57 mg/dL (ref >39)
LDL Chol Calc (NIH): 90 mg/dL (ref 0–99)
Triglycerides: 57 mg/dL (ref 0–149)
VLDL Cholesterol Cal: 12 mg/dL (ref 5–40)

## 2021-01-13 DIAGNOSIS — H401132 Primary open-angle glaucoma, bilateral, moderate stage: Secondary | ICD-10-CM | POA: Diagnosis not present

## 2021-01-31 DIAGNOSIS — Z23 Encounter for immunization: Secondary | ICD-10-CM | POA: Diagnosis not present

## 2021-02-07 DIAGNOSIS — Z23 Encounter for immunization: Secondary | ICD-10-CM | POA: Diagnosis not present

## 2021-08-19 ENCOUNTER — Encounter: Payer: Self-pay | Admitting: Family Medicine

## 2021-08-19 ENCOUNTER — Ambulatory Visit (INDEPENDENT_AMBULATORY_CARE_PROVIDER_SITE_OTHER): Payer: Medicare Other | Admitting: Family Medicine

## 2021-08-19 VITALS — BP 126/86 | HR 70 | Temp 96.8°F | Ht 69.0 in | Wt 179.2 lb

## 2021-08-19 DIAGNOSIS — E559 Vitamin D deficiency, unspecified: Secondary | ICD-10-CM

## 2021-08-19 DIAGNOSIS — Z8639 Personal history of other endocrine, nutritional and metabolic disease: Secondary | ICD-10-CM | POA: Diagnosis not present

## 2021-08-19 DIAGNOSIS — N529 Male erectile dysfunction, unspecified: Secondary | ICD-10-CM

## 2021-08-19 DIAGNOSIS — I4811 Longstanding persistent atrial fibrillation: Secondary | ICD-10-CM

## 2021-08-19 DIAGNOSIS — E785 Hyperlipidemia, unspecified: Secondary | ICD-10-CM | POA: Diagnosis not present

## 2021-08-19 DIAGNOSIS — I482 Chronic atrial fibrillation, unspecified: Secondary | ICD-10-CM

## 2021-08-19 MED ORDER — ATORVASTATIN CALCIUM 20 MG PO TABS: 20.0000 mg | ORAL_TABLET | Freq: Every day | ORAL | 3 refills | Status: DC

## 2021-08-19 MED ORDER — TADALAFIL 20 MG PO TABS: 20.0000 mg | ORAL_TABLET | Freq: Every day | ORAL | 5 refills | Status: DC | PRN

## 2021-08-19 NOTE — Progress Notes (Signed)
Riley Beasley is a 71 y.o. male who presents for annual wellness visit and follow-up on chronic medical conditions.  He has no particular concerns or complaints.  He takes excellent care of himself physically.  Exercises regularly.  Does not smoke.  Continues on his atorvastatin and also uses Cialis on an as-needed basis.  He does continue to work and enjoy his work. ?He has a history of chronic atrial fibs stating that he thinks he has had it since his high school days.  He does take a baby aspirin every day. ? ?Immunizations and Health Maintenance ?Immunization History  ?Administered Date(s) Administered  ? DTaP 05/03/2003  ? Influenza Split 03/11/2011, 03/15/2012  ? Influenza Whole 03/03/2010  ? Influenza, High Dose Seasonal PF 03/25/2015, 03/24/2016, 02/25/2019  ? Influenza,inj,Quad PF,6+ Mos 03/17/2013, 03/22/2014  ? Influenza-Unspecified 02/07/2021  ? Moderna Sars-Covid-2 Vaccination 07/01/2019, 07/29/2019, 02/28/2020  ? PFIZER SARS-COV-2 Pediatric Vaccination 5-14yrs 08/04/2020  ? [employer], [employer] 35yrs & up 02/07/2021  ? Pneumococcal Conjugate-13 03/25/2015  ? Pneumococcal Polysaccharide-23 03/24/2016  ? Tdap 05/03/2003, 03/11/2011  ? Zoster Recombinat (Shingrix) 02/25/2019  ? Zoster, Live 03/03/2010  ? ?Health Maintenance Due  ?Topic Date Due  ? Zoster Vaccines- Shingrix (2 of 2) 04/22/2019  ? TETANUS/TDAP  03/10/2021  ? ? ?Last colonoscopy: 06/08/13 Dr. Leone Payor  ?Last PSA:06/23/18 (2.4) ?Dentist: Q six months  ?Ophtho: Q four months ?Exercise: QD at the gym ? ?Other doctors caring for patient include: Dr. Leone Payor GI ? ?Advanced Directives: ?Does Patient Have a Medical Advance Directive?: Yes ?Type of Advance Directive: Living will, Healthcare Power of Attorney ?Does patient want to make changes to medical advance directive?: No - Patient declined ? ?Depression screen:  See questionnaire below.   ?  ? ?  08/19/2021  ?  9:22 AM 08/08/2020  ?  2:40 PM 06/29/2019  ?  8:27 AM 06/23/2018  ?  2:58  PM 06/21/2017  ?  9:56 AM  ?Depression screen PHQ 2/9  ?Decreased Interest 0 0 0 0 0  ?Down, Depressed, Hopeless 0 0 0 0 0  ?PHQ - 2 Score 0 0 0 0 0  ? ? ?Fall Screen: See Questionaire below. ?  ? ?  08/19/2021  ?  9:22 AM 08/08/2020  ?  2:40 PM 06/29/2019  ?  8:26 AM 06/23/2018  ?  2:58 PM 06/21/2017  ?  9:56 AM  ?Fall Risk   ?Falls in the past year? 0 0 0 1 No  ?Number falls in past yr: 0 0     ?Injury with Fall? 0 0     ?Risk for fall due to : No Fall Risks No Fall Risks     ?Follow up Falls evaluation completed Falls evaluation completed     ? ? ?ADL screen:  See questionnaire below.  ?Functional Status Survey: ?Is the patient deaf or have difficulty hearing?: No ?Does the patient have difficulty seeing, even when wearing glasses/contacts?: No ?Does the patient have difficulty concentrating, remembering, or making decisions?: No ?Does the patient have difficulty walking or climbing stairs?: No ?Does the patient have difficulty dressing or bathing?: No ?Does the patient have difficulty doing errands alone such as visiting a doctor's office or shopping?: No ? ? ?Review of Systems ? ?Constitutional: -, -unexpected weight change, -anorexia, -fatigue ?Allergy: -sneezing, -itching, -congestion ?Dermatology: denies changing moles, rash, lumps ?ENT: -runny nose, -ear pain, -sore throat,  ?Cardiology:  -chest pain, -palpitations, -orthopnea, ?Respiratory: -cough, -shortness of breath, -dyspnea on exertion, -wheezing,  ?Gastroenterology: -  abdominal pain, -nausea, -vomiting, -diarrhea, -constipation, -dysphagia ?Hematology: -bleeding or bruising problems ?Musculoskeletal: -arthralgias, -myalgias, -joint swelling, -back pain, - ?Ophthalmology: -vision changes,  ?Urology: -dysuria, -difficulty urinating,  -urinary frequency, -urgency, incontinence ?Neurology: -, -numbness, , -memory loss, -falls, -dizziness ?EKG shows atrial fibrillation unchanged since 2015. ? ? ?PHYSICAL EXAM: ? ?General Appearance: Alert, cooperative, no  distress, appears stated age ?Head: Normocephalic, without obvious abnormality, atraumatic ?Eyes: PERRL, conjunctiva/corneas clear, EOM's intact,  ?Ears: Normal TM's and external ear canals ?Nose: Nares normal, mucosa normal, no drainage or sinus   tenderness ?Throat: Lips, mucosa, and tongue normal; teeth and gums normal ?Neck: Supple, no lymphadenopathy, thyroid:no enlargement/tenderness/nodules; no carotid bruit or JVD ?Lungs: Clear to auscultation bilaterally without wheezes, rales or ronchi; respirations unlabored ?Heart: Slight bradycardia and irregular rhythm noted., S1 and S2 normal, no murmur, rub or gallop ?Abdomen: Soft, non-tender, nondistended, normoactive bowel sounds, no masses, no hepatosplenomegaly ?Extremities: No clubbing, cyanosis or edema ?Pulses: 2+ and symmetric all extremities ?Skin: Skin color, texture, turgor normal, no rashes or lesions ?Lymph nodes: Cervical, supraclavicular, and axillary nodes normal ?Neurologic: CNII-XII intact, normal strength, sensation and gait; reflexes 2+ and symmetric throughout   ?Psych: Normal mood, affect, hygiene and grooming ? ?ASSESSMENT/PLAN: ?Chronic atrial fibrillation (HCC) ? ?Erectile dysfunction, unspecified erectile dysfunction type ? ?Hyperlipidemia with target low density lipoprotein (LDL) cholesterol less than 100 mg/dL - Plan: Lipid panel ? ?History of vitamin D deficiency - Plan: VITAMIN D 25 Hydroxy (Vit-D Deficiency, Fractures) ? ?Longstanding persistent atrial fibrillation (HCC) - Plan: CBC with Differential/Platelet, Comprehensive metabolic panel ? ?Vitamin D deficiency disease - Plan: VITAMIN D 25 Hydroxy (Vit-D Deficiency, Fractures) ? ?I discussed atrial for with him in regard to CHADS2 score of 1.  Think it is reasonable to continue aspirin. ? ?Discussed at least 30 minutes of aerobic activity at least 5 days/week;  Immunization recommendations discussed.  Colonoscopy recommendations reviewed. ? ? ?Medicare Attestation ?I have personally  reviewed: ?The patient's medical and social history ?Their use of alcohol, tobacco or illicit drugs ?Their current medications and supplements ?The patient's functional ability including ADLs,fall risks, home safety risks, cognitive, and hearing and visual impairment ?Diet and physical activities ?Evidence for depression or mood disorders ? ?The patient's weight, height, and BMI have been recorded in the chart.  I have made referrals, counseling, and provided education to the patient based on review of the above and I have provided the patient with a written personalized care plan for preventive services.   ? ? ?Sharlot Gowda, MD   08/19/2021  ? ? ?  ?

## 2021-08-20 ENCOUNTER — Encounter: Payer: Self-pay | Admitting: Family Medicine

## 2021-08-20 ENCOUNTER — Other Ambulatory Visit: Payer: Self-pay | Admitting: Family Medicine

## 2021-08-20 DIAGNOSIS — N529 Male erectile dysfunction, unspecified: Secondary | ICD-10-CM

## 2021-08-20 LAB — COMPREHENSIVE METABOLIC PANEL
ALT: 14 IU/L (ref 0–44)
AST: 30 IU/L (ref 0–40)
Albumin/Globulin Ratio: 2 (ref 1.2–2.2)
Albumin: 4.8 g/dL — ABNORMAL HIGH (ref 3.7–4.7)
Alkaline Phosphatase: 65 IU/L (ref 44–121)
BUN/Creatinine Ratio: 18 (ref 10–24)
BUN: 20 mg/dL (ref 8–27)
Bilirubin Total: 1.3 mg/dL — ABNORMAL HIGH (ref 0.0–1.2)
CO2: 25 mmol/L (ref 20–29)
Calcium: 9.6 mg/dL (ref 8.6–10.2)
Chloride: 105 mmol/L (ref 96–106)
Creatinine, Ser: 1.14 mg/dL (ref 0.76–1.27)
Globulin, Total: 2.4 g/dL (ref 1.5–4.5)
Glucose: 101 mg/dL — ABNORMAL HIGH (ref 70–99)
Potassium: 5.2 mmol/L (ref 3.5–5.2)
Sodium: 142 mmol/L (ref 134–144)
Total Protein: 7.2 g/dL (ref 6.0–8.5)
eGFR: 68 mL/min/{1.73_m2} (ref >59)

## 2021-08-20 LAB — CBC WITH DIFFERENTIAL/PLATELET
Basophils Absolute: 0 10*3/uL (ref 0.0–0.2)
Basos: 1 %
EOS (ABSOLUTE): 0.2 10*3/uL (ref 0.0–0.4)
Eos: 2 %
Hematocrit: 47 % (ref 37.5–51.0)
Hemoglobin: 15.4 g/dL (ref 13.0–17.7)
Immature Grans (Abs): 0 10*3/uL (ref 0.0–0.1)
Immature Granulocytes: 0 %
Lymphocytes Absolute: 2.6 10*3/uL (ref 0.7–3.1)
Lymphs: 37 %
MCH: 27.3 pg (ref 26.6–33.0)
MCHC: 32.8 g/dL (ref 31.5–35.7)
MCV: 83 fL (ref 79–97)
Monocytes Absolute: 0.6 10*3/uL (ref 0.1–0.9)
Monocytes: 8 %
Neutrophils Absolute: 3.7 10*3/uL (ref 1.4–7.0)
Neutrophils: 52 %
Platelets: 159 10*3/uL (ref 150–450)
RBC: 5.65 x10E6/uL (ref 4.14–5.80)
RDW: 13.3 % (ref 11.6–15.4)
WBC: 7 10*3/uL (ref 3.4–10.8)

## 2021-08-20 LAB — LIPID PANEL
Chol/HDL Ratio: 2.8 ratio (ref 0.0–5.0)
Cholesterol, Total: 168 mg/dL (ref 100–199)
HDL: 61 mg/dL (ref >39)
LDL Chol Calc (NIH): 96 mg/dL (ref 0–99)
Triglycerides: 55 mg/dL (ref 0–149)
VLDL Cholesterol Cal: 11 mg/dL (ref 5–40)

## 2021-08-20 LAB — VITAMIN D 25 HYDROXY (VIT D DEFICIENCY, FRACTURES): Vit D, 25-Hydroxy: 45 ng/mL (ref 30.0–100.0)

## 2021-08-20 NOTE — Telephone Encounter (Signed)
[facility] is requesting to fill pt tadalafil. Please advise [facility] ?

## 2021-08-25 ENCOUNTER — Encounter: Payer: Self-pay | Admitting: Family Medicine

## 2021-08-25 NOTE — Addendum Note (Signed)
Addended by: Ronnald Nian on: 08/25/2021 08:06 AM ? ? Modules accepted: Orders ? ?

## 2022-01-07 ENCOUNTER — Encounter: Payer: Self-pay | Admitting: Internal Medicine

## 2022-01-17 DIAGNOSIS — Z23 Encounter for immunization: Secondary | ICD-10-CM | POA: Diagnosis not present

## 2022-01-19 DIAGNOSIS — H401132 Primary open-angle glaucoma, bilateral, moderate stage: Secondary | ICD-10-CM | POA: Diagnosis not present

## 2022-02-10 ENCOUNTER — Encounter: Payer: Self-pay | Admitting: Internal Medicine

## 2022-02-23 ENCOUNTER — Encounter: Payer: Self-pay | Admitting: Internal Medicine

## 2022-04-21 DIAGNOSIS — Z23 Encounter for immunization: Secondary | ICD-10-CM | POA: Diagnosis not present

## 2022-05-13 ENCOUNTER — Encounter: Payer: Self-pay | Admitting: Internal Medicine

## 2022-08-24 ENCOUNTER — Encounter: Payer: Self-pay | Admitting: Family Medicine

## 2022-08-24 ENCOUNTER — Ambulatory Visit (INDEPENDENT_AMBULATORY_CARE_PROVIDER_SITE_OTHER): Payer: Medicare Other | Admitting: Family Medicine

## 2022-08-24 VITALS — BP 136/80 | HR 53 | Temp 97.7°F | Resp 16 | Ht 70.0 in | Wt 180.0 lb

## 2022-08-24 DIAGNOSIS — N529 Male erectile dysfunction, unspecified: Secondary | ICD-10-CM | POA: Diagnosis not present

## 2022-08-24 DIAGNOSIS — E785 Hyperlipidemia, unspecified: Secondary | ICD-10-CM

## 2022-08-24 DIAGNOSIS — Z8042 Family history of malignant neoplasm of prostate: Secondary | ICD-10-CM | POA: Diagnosis not present

## 2022-08-24 DIAGNOSIS — E559 Vitamin D deficiency, unspecified: Secondary | ICD-10-CM

## 2022-08-24 DIAGNOSIS — I482 Chronic atrial fibrillation, unspecified: Secondary | ICD-10-CM | POA: Diagnosis not present

## 2022-08-24 DIAGNOSIS — H409 Unspecified glaucoma: Secondary | ICD-10-CM

## 2022-08-24 MED ORDER — TADALAFIL 20 MG PO TABS: ORAL_TABLET | ORAL | 5 refills | Status: DC

## 2022-08-24 MED ORDER — ATORVASTATIN CALCIUM 20 MG PO TABS: 20.0000 mg | ORAL_TABLET | Freq: Every day | ORAL | 3 refills | Status: DC

## 2022-08-24 NOTE — Progress Notes (Signed)
Riley Beasley is a 72 y.o. male who presents for annual wellness visit and follow-up on chronic medical conditions.  He has an underlying history of chronic atrial fibs and presently is on aspirin.  His chads is 0.  He continues on Cialis on an as-needed basis. He does have a history of glaucoma and is followed regularly by ophthalmology.  He also has a previous history of vitamin D deficiency.  There is a family history of prostate cancer however PSA tests have all been in the low 1 range.  Otherwise he has no particular concerns or complaints.  Immunizations and Health Maintenance Immunization History  Administered Date(s) Administered   COVID-19, mRNA, vaccine(Comirnaty)12 years and older 04/03/2022   DTaP 05/03/2003   Influenza Split 03/11/2011, 03/15/2012   Influenza Whole 03/03/2010   Influenza, High Dose Seasonal PF 03/25/2015, 03/24/2016, 02/25/2019   Influenza,inj,Quad PF,6+ Mos 03/17/2013, 03/22/2014   Influenza-Unspecified 02/07/2021, 03/18/2022   Moderna Sars-Covid-2 Vaccination 07/01/2019, 07/29/2019, 02/28/2020   PFIZER SARS-COV-2 Pediatric Vaccination 5-6yrs 08/04/2020   Pfizer Covid-19 Vaccine Bivalent Booster 55yrs & up 02/07/2021   Pneumococcal Conjugate-13 03/25/2015   Pneumococcal Polysaccharide-23 03/24/2016   Tdap 05/03/2003, 03/11/2011, 10/12/2020   Zoster Recombinat (Shingrix) 02/25/2019, 07/19/2020   Zoster, Live 03/03/2010   Health Maintenance Due  Topic Date Due   Medicare Annual Wellness (AWV)  08/20/2022    Last colonoscopy: 2015 Last PSA: 06/23/2018 Dentist: within the last year Ophtho: within the last year Exercise: cardio 4 days a week  Other doctors caring for patient include: Dr. Santiago Bumpers  Advanced Directives: yes    Depression screen:  See questionnaire below.        08/24/2022    3:13 PM 08/19/2021    9:22 AM 08/08/2020    2:40 PM 06/29/2019    8:27 AM 06/23/2018    2:58 PM  Depression screen PHQ 2/9  Decreased Interest 0 0 0 0 0  Down,  Depressed, Hopeless 0 0 0 0 0  PHQ - 2 Score 0 0 0 0 0    Fall Screen: See Questionaire below.      08/24/2022    3:13 PM 08/19/2021    9:22 AM 08/08/2020    2:40 PM 06/29/2019    8:26 AM 06/23/2018    2:58 PM  Fall Risk   Falls in the past year? 0 0 0 0 1  Number falls in past yr: 0 0 0    Injury with Fall? 0 0 0    Risk for fall due to : No Fall Risks No Fall Risks No Fall Risks    Follow up Falls evaluation completed Falls evaluation completed Falls evaluation completed    The 10-year ASCVD risk score (Arnett DK, et al., 2019) is: 12.3%   Values used to calculate the score:     Age: 71 years     Sex: Male     Is Non-Hispanic African American: Yes     Diabetic: No     Tobacco smoker: No     Systolic Blood Pressure: 136 mmHg     Is BP treated: No     HDL Cholesterol: 67 mg/dL     Total Cholesterol: 157 mg/dL   ADL screen:  See questionnaire below.  Functional Status Survey: Is the patient deaf or have difficulty hearing?: No Does the patient have difficulty seeing, even when wearing glasses/contacts?: No Does the patient have difficulty concentrating, remembering, or making decisions?: No Does the patient have difficulty walking or climbing stairs?: No Does  the patient have difficulty dressing or bathing?: No Does the patient have difficulty doing errands alone such as visiting a doctor's office or shopping?: No     Review of Systems  Constitutional: -, -unexpected weight change, -anorexia, -fatigue Allergy: -sneezing, -itching, -congestion Dermatology: denies changing moles, rash, lumps ENT: -runny nose, -ear pain, -sore throat,  Cardiology:  -chest pain, -palpitations, -orthopnea, Respiratory: -cough, -shortness of breath, -dyspnea on exertion, -wheezing,  Gastroenterology: -abdominal pain, -nausea, -vomiting, -diarrhea, -constipation, -dysphagia Hematology: -bleeding or bruising problems Musculoskeletal: -arthralgias, -myalgias, -joint swelling, -back pain,  - Ophthalmology: -vision changes,  Urology: -dysuria, -difficulty urinating,  -urinary frequency, -urgency, incontinence Neurology: -, -numbness, , -memory loss, -falls, -dizziness    PHYSICAL EXAM:  BP 136/80   Pulse (!) 53   Temp 97.7 F (36.5 C) (Oral)   Resp 16   Ht  (1.778 m)   Wt 180 lb (81.6 kg)   SpO2 99% Comment: room air  BMI 25.83 kg/m   General Appearance: Alert, cooperative, no distress, appears stated age Head: Normocephalic, without obvious abnormality, atraumatic Eyes: PERRL, conjunctiva/corneas clear, EOM's intact, Ears: Normal TM's and external ear canals Nose: Nares normal, mucosa normal, no drainage or sinus   tenderness Throat: Lips, mucosa, and tongue normal; teeth and gums normal Neck: Supple, no lymphadenopathy, thyroid:no enlargement/tenderness/nodules; no carotid bruit or JVD Lungs: Clear to auscultation bilaterally without wheezes, rales or ronchi; respirations unlabored Heart: Irregular rate and rhythm, S1 and S2 normal, no murmur, rub or gallop Abdomen: Soft, non-tender, nondistended, normoactive bowel sounds, no masses, no hepatosplenomegaly Skin: Skin color, texture, turgor normal, no rashes or lesions Lymph nodes: Cervical, supraclavicular, and axillary nodes normal Neurologic: CNII-XII intact, normal strength, sensation and gait; reflexes 2+ and symmetric throughout   Psych: Normal mood, affect, hygiene and grooming  ASSESSMENT/PLAN: Chronic atrial fibrillation - Plan: CBC with Differential/Platelet, Comprehensive metabolic panel  Hyperlipidemia with target low density lipoprotein (LDL) cholesterol less than 100 mg/dL - Plan: atorvastatin (LIPITOR) 20 MG tablet, Lipid panel  Glaucoma, unspecified glaucoma type, unspecified laterality  Family history of prostate cancer  Erectile dysfunction, unspecified erectile dysfunction type - Plan: tadalafil (CIALIS) 20 MG tablet  Vitamin D deficiency disease - Plan: VITAMIN D 25 Hydroxy (Vit-D  Deficiency, Fractures)    I encouraged him to continue to take good care of himself which she has done.  Discussed the use of aspirin at this point would not pursue it any further and till he reaches to 73 and reevaluate that.  Immunization recommendations discussed.  Colonoscopy recommendations reviewed.   Medicare Attestation I have personally reviewed: The patient's medical and social history Their use of alcohol, tobacco or illicit drugs Their current medications and supplements The patient's functional ability including ADLs,fall risks, home safety risks, cognitive, and hearing and visual impairment Diet and physical activities Evidence for depression or mood disorders  The patient's weight, height, and BMI have been recorded in the chart.  I have made referrals, counseling, and provided education to the patient based on review of the above and I have provided the patient with a written personalized care plan for preventive services.     Sharlot Gowda, MD   08/24/2022

## 2022-08-25 LAB — COMPREHENSIVE METABOLIC PANEL
ALT: 19 IU/L (ref 0–44)
AST: 29 IU/L (ref 0–40)
Albumin/Globulin Ratio: 1.8 (ref 1.2–2.2)
Albumin: 4.6 g/dL (ref 3.8–4.8)
Alkaline Phosphatase: 61 IU/L (ref 44–121)
BUN/Creatinine Ratio: 14 (ref 10–24)
BUN: 16 mg/dL (ref 8–27)
Bilirubin Total: 0.9 mg/dL (ref 0.0–1.2)
CO2: 23 mmol/L (ref 20–29)
Calcium: 9.5 mg/dL (ref 8.6–10.2)
Chloride: 103 mmol/L (ref 96–106)
Creatinine, Ser: 1.12 mg/dL (ref 0.76–1.27)
Globulin, Total: 2.5 g/dL (ref 1.5–4.5)
Glucose: 89 mg/dL (ref 70–99)
Potassium: 4.8 mmol/L (ref 3.5–5.2)
Sodium: 141 mmol/L (ref 134–144)
Total Protein: 7.1 g/dL (ref 6.0–8.5)
eGFR: 69 mL/min/{1.73_m2} (ref >59)

## 2022-08-25 LAB — CBC WITH DIFFERENTIAL/PLATELET
Basophils Absolute: 0.1 10*3/uL (ref 0.0–0.2)
Basos: 1 %
EOS (ABSOLUTE): 0.2 10*3/uL (ref 0.0–0.4)
Eos: 3 %
Hematocrit: 46.8 % (ref 37.5–51.0)
Hemoglobin: 15.5 g/dL (ref 13.0–17.7)
Immature Grans (Abs): 0 10*3/uL (ref 0.0–0.1)
Immature Granulocytes: 0 %
Lymphocytes Absolute: 2.6 10*3/uL (ref 0.7–3.1)
Lymphs: 37 %
MCH: 27.5 pg (ref 26.6–33.0)
MCHC: 33.1 g/dL (ref 31.5–35.7)
MCV: 83 fL (ref 79–97)
Monocytes Absolute: 0.5 10*3/uL (ref 0.1–0.9)
Monocytes: 7 %
Neutrophils Absolute: 3.7 10*3/uL (ref 1.4–7.0)
Neutrophils: 52 %
Platelets: 157 10*3/uL (ref 150–450)
RBC: 5.63 x10E6/uL (ref 4.14–5.80)
RDW: 12.8 % (ref 11.6–15.4)
WBC: 7 10*3/uL (ref 3.4–10.8)

## 2022-08-25 LAB — LIPID PANEL
Chol/HDL Ratio: 2.3 ratio (ref 0.0–5.0)
Cholesterol, Total: 157 mg/dL (ref 100–199)
HDL: 67 mg/dL (ref >39)
LDL Chol Calc (NIH): 78 mg/dL (ref 0–99)
Triglycerides: 60 mg/dL (ref 0–149)
VLDL Cholesterol Cal: 12 mg/dL (ref 5–40)

## 2022-08-25 LAB — VITAMIN D 25 HYDROXY (VIT D DEFICIENCY, FRACTURES): Vit D, 25-Hydroxy: 45.7 ng/mL (ref 30.0–100.0)

## 2022-11-20 ENCOUNTER — Other Ambulatory Visit: Payer: Self-pay | Admitting: Family Medicine

## 2022-11-20 DIAGNOSIS — N529 Male erectile dysfunction, unspecified: Secondary | ICD-10-CM

## 2022-12-28 ENCOUNTER — Other Ambulatory Visit: Payer: Self-pay | Admitting: Family Medicine

## 2022-12-28 DIAGNOSIS — E785 Hyperlipidemia, unspecified: Secondary | ICD-10-CM

## 2022-12-29 ENCOUNTER — Telehealth: Payer: Self-pay | Admitting: Family Medicine

## 2022-12-29 ENCOUNTER — Other Ambulatory Visit: Payer: Self-pay

## 2022-12-29 DIAGNOSIS — E785 Hyperlipidemia, unspecified: Secondary | ICD-10-CM

## 2022-12-29 MED ORDER — ATORVASTATIN CALCIUM 20 MG PO TABS: 20.0000 mg | ORAL_TABLET | Freq: Every day | ORAL | 2 refills | Status: DC

## 2022-12-29 NOTE — Telephone Encounter (Signed)
Rx sent 

## 2022-12-29 NOTE — Telephone Encounter (Signed)
Pt would like his atorvastatin to be sent to [facility] [id number] - [place], [place] - [address] instead because [facility] to get his prescription.

## 2023-01-21 DIAGNOSIS — H401132 Primary open-angle glaucoma, bilateral, moderate stage: Secondary | ICD-10-CM | POA: Diagnosis not present

## 2023-04-02 DIAGNOSIS — Z23 Encounter for immunization: Secondary | ICD-10-CM | POA: Diagnosis not present

## 2023-04-21 ENCOUNTER — Encounter: Payer: Self-pay | Admitting: Family Medicine

## 2023-04-21 ENCOUNTER — Ambulatory Visit (INDEPENDENT_AMBULATORY_CARE_PROVIDER_SITE_OTHER): Payer: Medicare Other | Admitting: Family Medicine

## 2023-04-21 VITALS — BP 134/84 | HR 80 | Temp 97.7°F | Wt 180.8 lb

## 2023-04-21 DIAGNOSIS — B070 Plantar wart: Secondary | ICD-10-CM | POA: Diagnosis not present

## 2023-04-21 NOTE — Progress Notes (Signed)
   Subjective:    Patient ID: Riley Beasley, male    DOB: Jan 08, 1951, 72 y.o.   MRN: [id number]  HPI He complains of slight discomfort over the plantar surface of the left third toe.   Review of Systems     Objective:    Physical Exam Exam of the left third toe does show a small wart approximately 0.5 cm in size       Assessment & Plan:  Plantar wart Explained that he needs to use Compound W regularly for several cycles in order to eliminate this completely.

## 2023-05-12 ENCOUNTER — Other Ambulatory Visit: Payer: Self-pay | Admitting: Family Medicine

## 2023-05-12 DIAGNOSIS — N529 Male erectile dysfunction, unspecified: Secondary | ICD-10-CM

## 2023-06-16 ENCOUNTER — Encounter: Payer: Self-pay | Admitting: Internal Medicine

## 2023-06-25 ENCOUNTER — Telehealth: Payer: Self-pay

## 2023-06-25 NOTE — Telephone Encounter (Signed)
Called pt, no answer, unable to leave voice mail.

## 2023-06-25 NOTE — Telephone Encounter (Signed)
Copied from CRM [id number]. Topic: General - Other >> Jun 25, 2023  3:03 PM Dominique A wrote: Reason for CRM: Patient states when he had his check up last year with his PCP he was told that he would need a colonoscopy. Patient states that he received a letter from [facility] for his colonoscopy. Patient states his PCP told him that he would have a cologuard test done. Patient wants to know if that test will be done at [facility] of at his PCP office. Please call patient back to discuss.

## 2023-06-29 ENCOUNTER — Encounter: Payer: Self-pay | Admitting: Internal Medicine

## 2023-09-14 ENCOUNTER — Ambulatory Visit (INDEPENDENT_AMBULATORY_CARE_PROVIDER_SITE_OTHER)

## 2023-09-14 VITALS — BP 126/78 | HR 64 | Temp 97.6°F | Ht 70.0 in | Wt 176.8 lb

## 2023-09-14 DIAGNOSIS — Z0000 Encounter for general adult medical examination without abnormal findings: Secondary | ICD-10-CM | POA: Diagnosis not present

## 2023-09-14 DIAGNOSIS — Z1211 Encounter for screening for malignant neoplasm of colon: Secondary | ICD-10-CM

## 2023-09-14 NOTE — Patient Instructions (Signed)
 Mr. Riley Beasley , Thank you for taking time out of your busy schedule to complete your Annual Wellness Visit with me. I enjoyed our conversation and look forward to speaking with you again next year. I, as well as your care team,  appreciate your ongoing commitment to your health goals. Please review the following plan we discussed and let me know if I can assist you in the future. Your Game plan/ To Do List    Referrals: If you haven't heard from the office you've been referred to, please reach out to them at the phone provided.  N/a Follow up Visits: Next Medicare AWV with our clinical staff: 09/26/2024 10:10   Have you seen your provider in the last 6 months (3 months if uncontrolled diabetes)? Yes Next Office Visit with your provider: 09/21/2023 at 4:15  Clinician Recommendations:  Aim for 30 minutes of exercise or brisk walking, 6-8 glasses of water, and 5 servings of fruits and vegetables each day.       This is a list of the screening recommended for you and due dates:  Health Maintenance  Topic Date Due   COVID-19 Vaccine (6 - 2024-25 season) 01/03/2023   Colon Cancer Screening  06/09/2023   Flu Shot  12/03/2023   Medicare Annual Wellness Visit  09/13/2024   DTaP/Tdap/Td vaccine (5 - Td or Tdap) 10/13/2030   Pneumonia Vaccine  Completed   Hepatitis C Screening  Completed   Zoster (Shingles) Vaccine  Completed   HPV Vaccine  Aged Out   Meningitis B Vaccine  Aged Out    Advanced directives: (In Chart) A copy of your advanced directives are scanned into your chart should your provider ever need it. Advance Care Planning is important because it:  [x]  Makes sure you receive the medical care that is consistent with your values, goals, and preferences  [x]  It provides guidance to your family and loved ones and reduces their decisional burden about whether or not they are making the right decisions based on your wishes.  Follow the link provided in your after visit summary or read over  the paperwork we have mailed to you to help you started getting your Advance Directives in place. If you need assistance in completing these, please reach out to us  so that we can help you!  See attachments for Preventive Care and Fall Prevention Tips.

## 2023-09-14 NOTE — Progress Notes (Signed)
 Subjective:   Riley Beasley is a 73 y.o. who presents for a Medicare Wellness preventive visit.  As a reminder, Annual Wellness Visits don't include a physical exam, and some assessments may be limited, especially if this visit is performed virtually. We may recommend an in-person visit if needed.  Visit Complete: In person    Persons Participating in Visit: Patient.  AWV Questionnaire: Yes: Patient Medicare AWV questionnaire was completed by the patient on 09/13/2023; I have confirmed that all information answered by patient is correct and no changes since this date.  Cardiac Risk Factors include: advanced age (>1men, >8 women);dyslipidemia;male gender     Objective:     Today's Vitals   09/14/23 1105  BP: 126/78  Pulse: 64  Temp: 97.6 F (36.4 C)  TempSrc: Oral  SpO2: 97%  Weight: 176 lb 12.8 oz (80.2 kg)  Height: 5\' 10"  (1.778 m)   Body mass index is 25.37 kg/m.     09/14/2023   11:10 AM 08/19/2021    9:21 AM 08/08/2020    2:40 PM 06/29/2019    8:27 AM 03/24/2016    9:18 AM 03/25/2015   10:05 AM  Advanced Directives  Does Patient Have a Medical Advance Directive? Yes Yes Yes Yes No No  Type of Estate agent of [place];Living will Living will;Healthcare Power of Attorney Living will Healthcare Power of [place];Living will;Out of facility DNR (pink MOST or yellow form)    Does patient want to make changes to medical advance directive?  No - Patient declined No - Patient declined No - Patient declined    Copy of Healthcare Power of Attorney in Chart? Yes - validated most recent copy scanned in chart (See row information)   No - copy requested    Would patient like information on creating a medical advance directive?     Yes - Educational materials given Yes - Transport planner given    Current Medications (verified) Outpatient Encounter Medications as of 09/14/2023  Medication Sig   Apoaequorin (PREVAGEN PO) Take by mouth.   aspirin 81 MG  tablet Take 81 mg by mouth daily.   atorvastatin  (LIPITOR) 20 MG tablet Take 1 tablet (20 mg total) by mouth daily.   cholecalciferol (VITAMIN D ) 1000 UNITS tablet Take 1,000 Units by mouth daily.   fish oil-omega-3 fatty acids 1000 MG capsule Take 1 g by mouth daily.   glucosamine-chondroitin 500-400 MG tablet Take 1 tablet by mouth 3 (three) times daily.   Magnesium Hydroxide (MAGNESIA PO) Take by mouth.   tadalafil  (CIALIS ) 20 MG tablet TAKE 1 TABLET BY MOUTH DAILY AS NEEDED FOR ERECTILE DYSFUNCTION   travoprost, benzalkonium, (TRAVATAN) 0.004 % ophthalmic solution Place 1 drop into both eyes at bedtime. Gets out of [place]   zinc gluconate 50 MG tablet Take 50 mg by mouth daily.   No facility-administered encounter medications on file as of 09/14/2023.    Allergies (verified) Patient has no known allergies.   History: Past Medical History:  Diagnosis Date   Atrial fib/flutter, transient (HCC)    Cataract 3 years ago   Slow growing   Dyslipidemia    Glaucoma    Hemorrhoids    Vitamin D  deficiency    Past Surgical History:  Procedure Laterality Date   COLONOSCOPY  2015, normal   Dr. Willy Harvest   Family History  Problem Relation Age of Onset   Prostate cancer Brother    Heart disease Brother    Heart disease Brother  Diabetes Mother    Alcohol abuse Father    Cancer Brother        Prostate cancer   Social History   Socioeconomic History   Marital status: Married    Spouse name: Not on file   Number of children: 3   Years of education: Not on file   Highest education level: Bachelor's degree (e.g., BA, AB, BS)  Occupational History   Occupation: Garment/textile technologist: [employer]  Tobacco Use   Smoking status: Never   Smokeless tobacco: Never  Vaping Use   Vaping status: Never Used  Substance and Sexual Activity   Alcohol use: Yes    Alcohol/week: 6.0 standard drinks of alcohol    Types: 6 Cans of beer per week    Comment: beer 1-2 6packs over the  weekend   Drug use: No   Sexual activity: Yes  Other Topics Concern   Not on file  Social History Narrative   Married, 2 sons 1 daughter   Engineer   2 caffeine drinks qd   Updated 04/19/2013         Social Drivers of Health   Financial Resource Strain: Low Risk  (09/13/2023)   Overall Financial Resource Strain (CARDIA)    Difficulty of Paying Living Expenses: Not hard at all  Food Insecurity: No Food Insecurity (09/13/2023)   Hunger Vital Sign    Worried About Running Out of Food in the Last Year: Never true    Ran Out of Food in the Last Year: Never true  Transportation Needs: No Transportation Needs (09/13/2023)   PRAPARE - Administrator, Civil Service (Medical): No    Lack of Transportation (Non-Medical): No  Physical Activity: Sufficiently Active (09/13/2023)   Exercise Vital Sign    Days of Exercise per Week: 6 days    Minutes of Exercise per Session: 40 min  Stress: No Stress Concern Present (09/13/2023)   [employer] of Occupational Health - Occupational Stress Questionnaire    Feeling of Stress : Not at all  Social Connections: Socially Integrated (09/13/2023)   Social Connection and Isolation Panel [NHANES]    Frequency of Communication with Friends and Family: Three times a week    Frequency of Social Gatherings with Friends and Family: Once a week    Attends Religious Services: More than 4 times per year    Active Member of [employer] or Organizations: Yes    Attends Engineer, structural: More than 4 times per year    Marital Status: Married    Tobacco Counseling Counseling given: Not Answered    Clinical Intake:  Pre-visit preparation completed: Yes  Pain : No/denies pain     Nutritional Status: BMI 25 -29 Overweight Nutritional Risks: None Diabetes: No  No results found for: "HGBA1C"   How often do you need to have someone help you when you read instructions, pamphlets, or other written materials from your doctor or  pharmacy?: 1 - Never  Interpreter Needed?: No  Information entered by :: NAllen LPN   Activities of Daily Living     09/14/2023   11:07 AM  In your present state of health, do you have any difficulty performing the following activities:  Hearing? 0  Vision? 0  Difficulty concentrating or making decisions? 0  Walking or climbing stairs? 0  Dressing or bathing? 0  Doing errands, shopping? 0  Preparing Food and eating ? N  Using the Toilet? N  In the past six  months, have you accidently leaked urine? N  Do you have problems with loss of bowel control? N  Managing your Medications? N  Managing your Finances? N  Housekeeping or managing your Housekeeping? N    Patient Care Team: Watson Hacking, MD as PCP - [facility], [facility]  Indicate any recent Medical Services you may have received from other than [facility] providers in the past year (date may be approximate).     Assessment:    This is a routine wellness examination for Auryn.  Hearing/Vision screen Hearing Screening - Comments:: Denies hearing issues Vision Screening - Comments:: Regular eye exams, Dr, Jorge Newcomer   Goals Addressed             This Visit's Progress    Patient Stated       09/14/2023, continue healthy lifestyle       Depression Screen     09/14/2023   11:11 AM 08/24/2022    3:13 PM 08/19/2021    9:22 AM 08/08/2020    2:40 PM 06/29/2019    8:27 AM 06/23/2018    2:58 PM 06/21/2017    9:56 AM  PHQ 2/9 Scores  PHQ - 2 Score 0 0 0 0 0 0 0  PHQ- 9 Score 0          Fall Risk     09/14/2023   11:11 AM 08/24/2022    3:13 PM 08/19/2021    9:22 AM 08/08/2020    2:40 PM 06/29/2019    8:26 AM  Fall Risk   Falls in the past year? 0 0 0 0 0  Number falls in past yr: 0 0 0 0   Injury with Fall? 0 0 0 0   Risk for fall due to : Medication side effect No Fall Risks No Fall Risks No Fall Risks   Follow up Falls prevention discussed;Falls evaluation completed Falls evaluation completed  Falls evaluation completed Falls evaluation completed     MEDICARE RISK AT HOME:  Medicare Risk at Home Any stairs in or around the home?: No If so, are there any without handrails?: No Home free of loose throw rugs in walkways, pet beds, electrical cords, etc?: Yes Adequate lighting in your home to reduce risk of falls?: Yes Life alert?: No Use of a cane, walker or w/c?: No Grab bars in the bathroom?: No Shower chair or bench in shower?: No Elevated toilet seat or a handicapped toilet?: No  TIMED UP AND GO:  Was the test performed?  Yes  Length of time to ambulate 10 feet: 5 sec Gait steady and fast without use of assistive device  Cognitive Function: 6CIT completed        09/14/2023   11:12 AM 08/19/2021    9:24 AM  6CIT Screen  What Year? 0 points 0 points  What month? 0 points 0 points  What time? 0 points 0 points  Count back from 20 0 points 0 points  Months in reverse 2 points 0 points  Repeat phrase 2 points 0 points  Total Score 4 points 0 points    Immunizations Immunization History  Administered Date(s) Administered   DTaP 05/03/2003   Influenza Split 03/11/2011, 03/15/2012   Influenza Whole 03/03/2010   Influenza, High Dose Seasonal PF 03/25/2015, 03/24/2016, 02/25/2019   Influenza,inj,Quad PF,6+ Mos 03/17/2013, 03/22/2014   Influenza-Unspecified 02/07/2021, 03/18/2022   Moderna Sars-Covid-2 Vaccination 07/01/2019, 07/29/2019, 02/28/2020   PFIZER SARS-COV-2 Pediatric Vaccination 5-77yrs 08/04/2020   Pfizer Covid-19 Vaccine  Bivalent Booster 1yrs & up 02/07/2021   Pfizer(Comirnaty)Fall Seasonal Vaccine 12 years and older 04/03/2022   Pneumococcal Conjugate-13 03/25/2015   Pneumococcal Polysaccharide-23 03/24/2016   Tdap 05/03/2003, 03/11/2011, 10/12/2020   Zoster Recombinant(Shingrix) 02/25/2019, 07/19/2020   Zoster, Live 03/03/2010    Screening Tests Health Maintenance  Topic Date Due   COVID-19 Vaccine (6 - 2024-25 season) 01/03/2023    Colonoscopy  06/09/2023   INFLUENZA VACCINE  12/03/2023   Medicare Annual Wellness (AWV)  09/13/2024   DTaP/Tdap/Td (5 - Td or Tdap) 10/13/2030   Pneumonia Vaccine 60+ Years old  Completed   Hepatitis C Screening  Completed   Zoster Vaccines- Shingrix  Completed   HPV VACCINES  Aged Out   Meningococcal B Vaccine  Aged Out    Health Maintenance  Health Maintenance Due  Topic Date Due   COVID-19 Vaccine (6 - 2024-25 season) 01/03/2023   Colonoscopy  06/09/2023   Health Maintenance Items Addressed: Cologuard Ordered  Additional Screening:  Vision Screening: Recommended annual ophthalmology exams for early detection of glaucoma and other disorders of the eye.  Dental Screening: Recommended annual dental exams for proper oral hygiene  Community Resource Referral / Chronic Care Management: CRR required this visit?  No   CCM required this visit?  No   Plan:    I have personally reviewed and noted the following in the patient's chart:   Medical and social history Use of alcohol, tobacco or illicit drugs  Current medications and supplements including opioid prescriptions. Patient is not currently taking opioid prescriptions. Functional ability and status Nutritional status Physical activity Advanced directives List of other physicians Hospitalizations, surgeries, and ER visits in previous 12 months Vitals Screenings to include cognitive, depression, and falls Referrals and appointments  In addition, I have reviewed and discussed with patient certain preventive protocols, quality metrics, and best practice recommendations. A written personalized care plan for preventive services as well as general preventive health recommendations were provided to patient.   Areatha Beecham, LPN   1/61/0960   After Visit Summary: (In Person-Printed) AVS printed and given to the patient  Notes: Nothing significant to report at this time.

## 2023-09-20 DIAGNOSIS — Z1211 Encounter for screening for malignant neoplasm of colon: Secondary | ICD-10-CM | POA: Diagnosis not present

## 2023-09-21 ENCOUNTER — Ambulatory Visit (INDEPENDENT_AMBULATORY_CARE_PROVIDER_SITE_OTHER): Admitting: Family Medicine

## 2023-09-21 ENCOUNTER — Encounter: Payer: Self-pay | Admitting: Family Medicine

## 2023-09-21 VITALS — BP 128/68 | HR 78 | Ht 70.0 in | Wt 175.6 lb

## 2023-09-21 DIAGNOSIS — E785 Hyperlipidemia, unspecified: Secondary | ICD-10-CM | POA: Diagnosis not present

## 2023-09-21 DIAGNOSIS — I482 Chronic atrial fibrillation, unspecified: Secondary | ICD-10-CM | POA: Diagnosis not present

## 2023-09-21 DIAGNOSIS — Z8042 Family history of malignant neoplasm of prostate: Secondary | ICD-10-CM

## 2023-09-21 DIAGNOSIS — H409 Unspecified glaucoma: Secondary | ICD-10-CM | POA: Diagnosis not present

## 2023-09-21 MED ORDER — ATORVASTATIN CALCIUM 20 MG PO TABS: 20.0000 mg | ORAL_TABLET | Freq: Every day | ORAL | 2 refills | Status: DC

## 2023-09-21 NOTE — Progress Notes (Signed)
   Subjective:    Patient ID: Riley Beasley, male    DOB: [birth year], 73 y.o.   MRN: [id number]  HPI He is here for an interval evaluation.  He does have underlying glaucoma and is followed by ophthalmology regularly for that.  He does have atrial for but is not on any medications.  He recently did get Cologuard sent.  He does have a previous family history of prostate cancer however his previous PSAs have all been below 1.  He continues on atorvastatin .  He also uses Cialis  on an as-needed basis.  Review of Systems     Objective:    Physical Exam Alert and in no distress. Tympanic membranes and canals are normal. Pharyngeal area is normal. Neck is supple without adenopathy or thyromegaly. Cardiac exam shows an irregular  rhythm without murmurs or gallops. Lungs are clear to auscultation.        Assessment & Plan:  Hyperlipidemia with target low density lipoprotein (LDL) cholesterol less than 100 mg/dL - Plan: Lipid panel, atorvastatin  (LIPITOR) 20 MG tablet  Chronic atrial fibrillation (HCC) - Plan: CBC with Differential/Platelet, Comprehensive metabolic panel with GFR  Glaucoma, unspecified glaucoma type, unspecified laterality  Family history of prostate cancer  He is still working and enjoying the job.  Home life is going well.  Explained that Medicare will not let us  check the PSA and his risk of prostate cancer is very low.  Also discussed the fact that next year we might change the need for his treatment of atrial fib.

## 2023-09-22 ENCOUNTER — Ambulatory Visit: Payer: Self-pay | Admitting: Family Medicine

## 2023-09-22 LAB — CBC WITH DIFFERENTIAL/PLATELET
Basophils Absolute: 0 10*3/uL (ref 0.0–0.2)
Basos: 1 %
EOS (ABSOLUTE): 0.1 10*3/uL (ref 0.0–0.4)
Eos: 2 %
Hematocrit: 47 % (ref 37.5–51.0)
Hemoglobin: 14.8 g/dL (ref 13.0–17.7)
Immature Grans (Abs): 0 10*3/uL (ref 0.0–0.1)
Immature Granulocytes: 0 %
Lymphocytes Absolute: 2.5 10*3/uL (ref 0.7–3.1)
Lymphs: 37 %
MCH: 27.4 pg (ref 26.6–33.0)
MCHC: 31.5 g/dL (ref 31.5–35.7)
MCV: 87 fL (ref 79–97)
Monocytes Absolute: 0.5 10*3/uL (ref 0.1–0.9)
Monocytes: 7 %
Neutrophils Absolute: 3.6 10*3/uL (ref 1.4–7.0)
Neutrophils: 53 %
Platelets: 204 10*3/uL (ref 150–450)
RBC: 5.41 x10E6/uL (ref 4.14–5.80)
RDW: 13.5 % (ref 11.6–15.4)
WBC: 6.8 10*3/uL (ref 3.4–10.8)

## 2023-09-22 LAB — COMPREHENSIVE METABOLIC PANEL WITH GFR
ALT: 23 IU/L (ref 0–44)
AST: 33 IU/L (ref 0–40)
Albumin: 4.5 g/dL (ref 3.8–4.8)
Alkaline Phosphatase: 63 IU/L (ref 44–121)
BUN/Creatinine Ratio: 14 (ref 10–24)
BUN: 16 mg/dL (ref 8–27)
Bilirubin Total: 0.7 mg/dL (ref 0.0–1.2)
CO2: 22 mmol/L (ref 20–29)
Calcium: 8.9 mg/dL (ref 8.6–10.2)
Chloride: 101 mmol/L (ref 96–106)
Creatinine, Ser: 1.15 mg/dL (ref 0.76–1.27)
Globulin, Total: 2.3 g/dL (ref 1.5–4.5)
Sodium: 142 mmol/L (ref 134–144)
Total Protein: 6.8 g/dL (ref 6.0–8.5)
eGFR: 67 mL/min/{1.73_m2} (ref >59)

## 2023-09-22 LAB — LIPID PANEL
Chol/HDL Ratio: 3 ratio (ref 0.0–5.0)
Cholesterol, Total: 178 mg/dL (ref 100–199)
HDL: 59 mg/dL (ref >39)
LDL Chol Calc (NIH): 108 mg/dL — ABNORMAL HIGH (ref 0–99)
Triglycerides: 55 mg/dL (ref 0–149)
VLDL Cholesterol Cal: 11 mg/dL (ref 5–40)

## 2023-09-26 LAB — COLOGUARD: COLOGUARD: NEGATIVE

## 2023-09-28 ENCOUNTER — Ambulatory Visit: Payer: Self-pay | Admitting: Family Medicine

## 2023-11-17 ENCOUNTER — Other Ambulatory Visit: Payer: Self-pay | Admitting: Medical Genetics

## 2023-12-13 ENCOUNTER — Other Ambulatory Visit: Payer: Self-pay | Admitting: Family Medicine

## 2023-12-13 DIAGNOSIS — E785 Hyperlipidemia, unspecified: Secondary | ICD-10-CM

## 2024-01-24 DIAGNOSIS — H401132 Primary open-angle glaucoma, bilateral, moderate stage: Secondary | ICD-10-CM | POA: Diagnosis not present

## 2024-01-31 ENCOUNTER — Other Ambulatory Visit: Payer: Self-pay | Admitting: Family Medicine

## 2024-01-31 DIAGNOSIS — E785 Hyperlipidemia, unspecified: Secondary | ICD-10-CM

## 2024-02-09 DIAGNOSIS — H47231 Glaucomatous optic atrophy, right eye: Secondary | ICD-10-CM | POA: Diagnosis not present

## 2024-02-09 DIAGNOSIS — H401132 Primary open-angle glaucoma, bilateral, moderate stage: Secondary | ICD-10-CM | POA: Diagnosis not present

## 2024-02-14 ENCOUNTER — Other Ambulatory Visit: Payer: Self-pay | Admitting: Family Medicine

## 2024-02-14 DIAGNOSIS — N529 Male erectile dysfunction, unspecified: Secondary | ICD-10-CM

## 2024-02-14 MED ORDER — TADALAFIL 20 MG PO TABS: ORAL_TABLET | ORAL | 1 refills | Status: AC

## 2024-02-14 NOTE — Telephone Encounter (Signed)
 Copied from CRM [id number]. Topic: Clinical - Medication Refill >> Feb 14, 2024  9:43 AM Treva T wrote: Medication: tadalafil  (CIALIS ) 20 MG tablet  Has the patient contacted their pharmacy? Yes, advised to contact office  This is the patient's preferred pharmacy:   [facility] [id number] [place], [place] - [address] [place] [address] Phone: [phone] Fax: [phone]  Is this the correct pharmacy for this prescription? Yes If no, delete pharmacy and type the correct one.   Has the prescription been filled recently? Yes  Is the patient out of the medication? Yes  Has the patient been seen for an appointment in the last year OR does the patient have an upcoming appointment? Yes  Can we respond through MyChart? Yes  Agent: Please be advised that Rx refills may take up to 3 business days. We ask that you follow-up with your pharmacy.

## 2024-02-22 ENCOUNTER — Other Ambulatory Visit: Payer: Self-pay | Admitting: Medical Genetics

## 2024-02-22 DIAGNOSIS — Z006 Encounter for examination for normal comparison and control in clinical research program: Secondary | ICD-10-CM

## 2024-02-26 DIAGNOSIS — Z23 Encounter for immunization: Secondary | ICD-10-CM | POA: Diagnosis not present

## 2024-03-21 LAB — GENECONNECT MOLECULAR SCREEN: Genetic Analysis Overall Interpretation: NEGATIVE

## 2024-09-26 ENCOUNTER — Ambulatory Visit: Payer: Self-pay
